# Patient Record
Sex: Male | Born: 1960 | Hispanic: No | Marital: Single | Smoking: Never smoker
Health system: Southern US, Community
[De-identification: ages and names within clinical notes are randomized; demographics above are authoritative.]

## PROBLEM LIST (undated history)

## (undated) DIAGNOSIS — E785 Hyperlipidemia, unspecified: Secondary | ICD-10-CM

## (undated) DIAGNOSIS — B20 Human immunodeficiency virus [HIV] disease: Secondary | ICD-10-CM

## (undated) DIAGNOSIS — Z21 Asymptomatic human immunodeficiency virus [HIV] infection status: Secondary | ICD-10-CM

## (undated) DIAGNOSIS — I10 Essential (primary) hypertension: Secondary | ICD-10-CM

## (undated) HISTORY — DX: Essential (primary) hypertension: I10

## (undated) HISTORY — DX: Hyperlipidemia, unspecified: E78.5

## (undated) HISTORY — DX: Asymptomatic human immunodeficiency virus (hiv) infection status: Z21

## (undated) HISTORY — DX: Human immunodeficiency virus (HIV) disease: B20

## (undated) HISTORY — PX: INCISION AND DRAINAGE ABSCESS: SHX5864

---

## 2013-12-02 ENCOUNTER — Encounter (INDEPENDENT_AMBULATORY_CARE_PROVIDER_SITE_OTHER): Payer: Self-pay

## 2013-12-02 ENCOUNTER — Ambulatory Visit: Payer: Medicaid Other | Attending: Internal Medicine | Admitting: Internal Medicine

## 2013-12-02 VITALS — BP 163/78 | HR 65 | Temp 97.9°F | Resp 16 | Wt 183.8 lb

## 2013-12-02 DIAGNOSIS — Z0000 Encounter for general adult medical examination without abnormal findings: Secondary | ICD-10-CM

## 2013-12-02 DIAGNOSIS — E669 Obesity, unspecified: Secondary | ICD-10-CM

## 2013-12-02 DIAGNOSIS — I10 Essential (primary) hypertension: Secondary | ICD-10-CM

## 2013-12-02 MED ORDER — HYDROCORTISONE ACETATE 25 MG RE SUPP: 25.0000 mg | Freq: Two times a day (BID) | RECTAL | Status: DC

## 2013-12-02 MED ORDER — HYDROCHLOROTHIAZIDE 25 MG PO TABS
25.0000 mg | ORAL_TABLET | Freq: Every day | ORAL | Status: DC
Start: 2013-12-02 — End: 2014-03-14

## 2013-12-02 MED ORDER — HYDROCORTISONE 2.5 % RE CREA: 1.0000 "application " | TOPICAL_CREAM | Freq: Two times a day (BID) | RECTAL | Status: DC

## 2013-12-02 MED ORDER — HYDROCORTISONE 2.5 % EX OINT: TOPICAL_OINTMENT | Freq: Two times a day (BID) | CUTANEOUS | Status: DC

## 2013-12-02 NOTE — Progress Notes (Unsigned)
Patient ID: Isaiah Mclaughlin, male   DOB: 1961/02/02, 53 y.o.   MRN: [id number]   HPI: Isaiah Mclaughlin is a 53 y.o. male presenting on 12/02/2013 to establish care and for treatment of hemorrhoids. He notes itching in the anal area and mild pain - no bleeding.     History reviewed. No pertinent past medical history.   past surgical history- abscess I and D left abdomen/flank  Current Outpatient Prescriptions  Medication Sig Dispense Refill  . hydrocortisone (ANUSOL-HC) 25 MG suppository Place 1 suppository (25 mg total) rectally 2 (two) times daily.  12 suppository  3  . hydrocortisone 2.5 % ointment Apply topically 2 (two) times daily.  30 g  0   No current facility-administered medications for this visit.    No Known Allergies  History reviewed. No pertinent family history.  History   Social History  . Marital Status: Single    Spouse Name: N/A    Number of Children: N/A  . Years of Education: N/A   Occupational History  . Not on file.   Social History Main Topics  . Smoking status: Never Smoker   . Smokeless tobacco: Not on file  . Alcohol Use: Yes  . Drug Use: Not on file  . Sexual Activity: Not on file   Other Topics Concern  . Not on file   Social History Narrative  . No narrative on file    Review of Systems  Review of Systems  Constitutional: Negative for fever, chills, diaphoresis, activity change, appetite change + fatigue.  HENT: Negative for ear pain, nosebleeds, congestion, facial swelling, rhinorrhea, neck pain, neck stiffness and ear discharge.  Eyes: Negative for pain, discharge, redness, itching and visual disturbance.  Respiratory: Negative for cough, choking, chest tightness, shortness of breath, wheezing and stridor.  Cardiovascular: Negative for chest pain, palpitations and leg swelling.  Gastrointestinal: Negative for abdominal distention, vomiting, diarrhea or consitpation Genitourinary: Negative for dysuria, urgency, frequency,  hematuria, flank pain, decreased urine volume, difficulty urinating and dyspareunia.  Musculoskeletal: Negative for back pain, joint swelling, arthralgias or gait problem.  Neurological: Negative for dizziness, tremors, seizures, syncope, facial asymmetry, speech difficulty, weakness, light-headedness, numbness and headaches.  Hematological: Negative for adenopathy. Does not bruise/bleed easily.  Psychiatric/Behavioral: Negative for hallucinations, behavioral problems, confusion, dysphoric mood   Objective:  BP 163/78  Pulse 65  Temp(Src) 97.9 F (36.6 C)  Resp 16  Wt 183 lb 12.8 oz (83.371 kg)  SpO2 100% Filed Weights   12/02/13 0936  Weight: 183 lb 12.8 oz (83.371 kg)     Physical Exam  Constitutional: Appears well-developed and well-nourished. No distress. HENT: Normocephalic. External right and left ear normal. Oropharynx is clear and moist.  Eyes: Conjunctivae and EOM are normal. PERRLA, no scleral icterus.  Neck: Normal ROM. Neck supple. No JVD. No tracheal deviation. No thyromegaly.  CVS: RRR, S1/S2 +, no murmurs, no gallops, no carotid bruit.  Pulmonary: Effort and breath sounds normal, no stridor, rhonchi, wheezes, rales.  Abdominal: Soft. BS +,  no distension, tenderness, rebound or guarding.  Musculoskeletal: Normal range of motion. No edema and no tenderness.  Neuro: Alert. Normal reflexes, muscle tone coordination. No cranial nerve deficit. Skin: Skin is warm and dry.  dry hyperkeratotic rash on dorsum of right foot  Not diaphoretic. No erythema. No pallor.  Psychiatric: Normal mood and affect. Behavior, judgment, thought content normal.   No results found for this basename: WBC,  HGB,  HCT,  MCV,  PLT   No results found  for this basename: CREATININE,  BUN,  NA,  K,  CL,  CO2    No results found for this basename: HGBA1C   Lipid Panel  No results found for this basename: chol,  trig,  hdl,  cholhdl,  vldl,  ldlcalc        There are no active problems to  display for this patient.    Preventative Medicine:  Health Maintenance  Topic Date Due  . Tetanus/tdap  09/28/1980  . Colonoscopy  09/29/2011  . Influenza Vaccine  04/28/2013    Adult vaccines due  Topic Date Due  . Tetanus/tdap  09/28/1980    Colonoscopy : ordered referral   LAB WORK: ordered Metabolic panel: CBC:  Vitamin D : Lipid Panel: TSH: PSA:    Assessment and plan: Routine history and physical examination of adult - Plan: CBC, COMPLETE METABOLIC PANEL WITH GFR, TSH, Lipid panel, Vit D  25 hydroxy (rtn osteoporosis monitoring), PSA, Ambulatory referral to Dentistry  HTN (hypertension) -  - as I do not have a baseline Cr, will avoid ACE I- start HCTZ 25 mg - can add ACE later if renal function reasonable  Obesity, unspecified -  - wt loss discussed   Internal hemorrhoids? - try anusol suppositories  Rash- eczema - hydrocort 2.5 % ointment   Return in about 3 months (around 03/04/2014).   The patient was given clear instructions to go to ER or return to medical center if symptoms don't improve, worsen or new problems develop. The patient verbalized understanding. The patient was told to call to get lab results if they haven't heard anything in the next week.     Debbe Odea, MD

## 2013-12-02 NOTE — Progress Notes (Unsigned)
Patient here to establish care Complains of having some hemorrhoids and some gas after meals Takes no prescribed medications

## 2013-12-08 ENCOUNTER — Ambulatory Visit: Payer: Medicaid Other | Attending: Internal Medicine

## 2013-12-08 DIAGNOSIS — Z0000 Encounter for general adult medical examination without abnormal findings: Secondary | ICD-10-CM

## 2013-12-08 DIAGNOSIS — E669 Obesity, unspecified: Secondary | ICD-10-CM

## 2013-12-08 DIAGNOSIS — I10 Essential (primary) hypertension: Secondary | ICD-10-CM

## 2013-12-08 LAB — LIPID PANEL
Cholesterol: 199 mg/dL (ref 0–200)
HDL: 39 mg/dL — ABNORMAL LOW
LDL Cholesterol: 143 mg/dL — ABNORMAL HIGH (ref 0–99)
Total CHOL/HDL Ratio: 5.1 ratio
Triglycerides: 86 mg/dL
VLDL: 17 mg/dL (ref 0–40)

## 2013-12-08 LAB — COMPLETE METABOLIC PANEL WITH GFR
ALK PHOS: 59 U/L (ref 39–117)
ALT: 36 U/L (ref 0–53)
AST: 18 U/L (ref 0–37)
Albumin: 4.4 g/dL (ref 3.5–5.2)
BILIRUBIN TOTAL: 0.7 mg/dL (ref 0.2–1.2)
BUN: 13 mg/dL (ref 6–23)
CO2: 31 mEq/L (ref 19–32)
Calcium: 9.2 mg/dL (ref 8.4–10.5)
Chloride: 97 mEq/L (ref 96–112)
Creat: 0.77 mg/dL (ref 0.50–1.35)
GFR, Est African American: >89 mL/min
GFR, Est Non African American: >89 mL/min
Glucose, Bld: 81 mg/dL (ref 70–99)
Potassium: 3.6 mEq/L (ref 3.5–5.3)
Sodium: 136 mEq/L (ref 135–145)
Total Protein: 7.7 g/dL (ref 6.0–8.3)

## 2013-12-08 LAB — TSH: TSH: 3.676 u[IU]/mL (ref 0.350–4.500)

## 2013-12-08 LAB — CBC
HCT: 45.5 % (ref 39.0–52.0)
Hemoglobin: 16.1 g/dL (ref 13.0–17.0)
MCH: 28.6 pg (ref 26.0–34.0)
MCHC: 35.4 g/dL (ref 30.0–36.0)
MCV: 81 fL (ref 78.0–100.0)
PLATELETS: 24 10*3/uL — AB (ref 150–400)
RBC: 5.62 MIL/uL (ref 4.22–5.81)
RDW: 13.9 % (ref 11.5–15.5)
WBC: 5.7 10*3/uL (ref 4.0–10.5)

## 2013-12-09 LAB — VITAMIN D 25 HYDROXY (VIT D DEFICIENCY, FRACTURES): Vit D, 25-Hydroxy: 31 ng/mL (ref 30–89)

## 2013-12-09 LAB — PSA: PSA: 0.8 ng/mL (ref <=4.00)

## 2013-12-14 ENCOUNTER — Telehealth: Payer: Self-pay | Admitting: Emergency Medicine

## 2013-12-14 NOTE — Telephone Encounter (Signed)
Left message for pt to call ASAP

## 2013-12-14 NOTE — Telephone Encounter (Signed)
Left message #2 attempt for pt to call ASAP

## 2013-12-14 NOTE — Telephone Encounter (Signed)
Message copied by Ricci Barker on Thu Dec 14, 2013  1:40 PM ------      Message from: Debbe Odea      Created: Thu Dec 14, 2013 10:15 AM       Please have pt come back for CBC, PT/ INR and PTT ASAP. Schedule earliest possible appt to review these labs. Thanks. ------

## 2013-12-15 ENCOUNTER — Ambulatory Visit: Payer: Medicaid Other | Attending: Internal Medicine

## 2013-12-15 DIAGNOSIS — R7989 Other specified abnormal findings of blood chemistry: Secondary | ICD-10-CM

## 2013-12-15 LAB — CBC WITH DIFFERENTIAL/PLATELET
Basophils Absolute: 0 10*3/uL (ref 0.0–0.1)
Basophils Relative: 0 % (ref 0–1)
EOS ABS: 0.6 10*3/uL (ref 0.0–0.7)
EOS PCT: 9 % — AB (ref 0–5)
HEMATOCRIT: 45.6 % (ref 39.0–52.0)
Hemoglobin: 16.1 g/dL (ref 13.0–17.0)
LYMPHS ABS: 3 10*3/uL (ref 0.7–4.0)
Lymphocytes Relative: 43 % (ref 12–46)
MCH: 28.9 pg (ref 26.0–34.0)
MCHC: 35.3 g/dL (ref 30.0–36.0)
MCV: 81.9 fL (ref 78.0–100.0)
MONO ABS: 0.8 10*3/uL (ref 0.1–1.0)
Monocytes Relative: 11 % (ref 3–12)
Neutro Abs: 2.6 10*3/uL (ref 1.7–7.7)
Neutrophils Relative %: 37 % — ABNORMAL LOW (ref 43–77)
PLATELETS: 27 10*3/uL — AB (ref 150–400)
RBC: 5.57 MIL/uL (ref 4.22–5.81)
RDW: 14 % (ref 11.5–15.5)
WBC: 6.9 10*3/uL (ref 4.0–10.5)

## 2013-12-16 LAB — PROTIME-INR
INR: 0.99 (ref <1.50)
Prothrombin Time: 13 seconds (ref 11.6–15.2)

## 2013-12-16 LAB — APTT: aPTT: 31 seconds (ref 24–37)

## 2013-12-21 ENCOUNTER — Telehealth: Payer: Self-pay | Admitting: Emergency Medicine

## 2013-12-21 NOTE — Telephone Encounter (Signed)
Left message for pt to return call for U/S scheduling and referral

## 2013-12-22 ENCOUNTER — Ambulatory Visit: Payer: Medicaid Other | Attending: Internal Medicine

## 2013-12-22 VITALS — BP 148/83 | HR 64 | Temp 98.2°F | Resp 16

## 2013-12-22 DIAGNOSIS — R079 Chest pain, unspecified: Secondary | ICD-10-CM

## 2013-12-22 MED ORDER — NITROGLYCERIN 0.4 MG SL SUBL: 0.4000 mg | SUBLINGUAL_TABLET | SUBLINGUAL | Status: DC | PRN

## 2013-12-22 NOTE — Patient Instructions (Addendum)
Pt instructed to go directly to ER if Chest pain continues with taking Nitro Pt also instructed not to take [facility] Hematology appt 12/25/13 @ [place] Pt given U/S appt scheduled 12/26/13 at [facility] hospital @1045  am Echo appt 12/29/13 @ 11am scheduled Tigranian language line used for intepretation

## 2013-12-22 NOTE — Progress Notes (Unsigned)
Pt is here for BP check only. 

## 2013-12-25 ENCOUNTER — Telehealth: Payer: Self-pay | Admitting: Internal Medicine

## 2013-12-25 ENCOUNTER — Telehealth: Payer: Self-pay | Admitting: Emergency Medicine

## 2013-12-25 NOTE — Telephone Encounter (Signed)
Message copied by Ricci Barker on Mon Dec 25, 2013  5:19 PM ------      Message from: Allyson Sabal MD, Ascencion Dike      Created: Wed Dec 20, 2013 11:21 AM       Notify patient that platelet count was still unchanged at 27. He has been referred to hematology for thrombocytopenia. He has been recommended to get an ultrasound to rule out cirrhosis. He has also been recommended to refrain from alcohol and NSAID. ------

## 2013-12-25 NOTE — Progress Notes (Signed)
U/s scheduled. Pt aware of results

## 2013-12-25 NOTE — Telephone Encounter (Signed)
C/D 12/25/13 for appt. 12/27/13

## 2013-12-25 NOTE — Telephone Encounter (Signed)
Called [employer] to call patient and gave new patient appt for 04/01 @ 1:30 w/Dr. Julien Nordmann.  Referring Dr. Reyne Dumas  Dx- Low Plt Ct Address was text by [facility].

## 2013-12-26 ENCOUNTER — Ambulatory Visit: Payer: Medicaid Other | Attending: Internal Medicine

## 2013-12-26 ENCOUNTER — Other Ambulatory Visit: Payer: Self-pay | Admitting: *Deleted

## 2013-12-26 ENCOUNTER — Ambulatory Visit (HOSPITAL_COMMUNITY)
Admission: RE | Admit: 2013-12-26 | Discharge: 2013-12-26 | Disposition: A | Discharge status: Home / Self Care | Payer: Medicaid Other | Source: Ambulatory Visit | Attending: Internal Medicine | Admitting: Internal Medicine

## 2013-12-26 DIAGNOSIS — R7989 Other specified abnormal findings of blood chemistry: Secondary | ICD-10-CM

## 2013-12-26 DIAGNOSIS — D696 Thrombocytopenia, unspecified: Secondary | ICD-10-CM

## 2013-12-26 DIAGNOSIS — R6889 Other general symptoms and signs: Secondary | ICD-10-CM | POA: Insufficient documentation

## 2013-12-26 DIAGNOSIS — Q619 Cystic kidney disease, unspecified: Secondary | ICD-10-CM | POA: Insufficient documentation

## 2013-12-26 LAB — CBC WITH DIFFERENTIAL/PLATELET
BASOS PCT: 0 % (ref 0–1)
Basophils Absolute: 0 10*3/uL (ref 0.0–0.1)
EOS ABS: 0.1 10*3/uL (ref 0.0–0.7)
Eosinophils Relative: 3 % (ref 0–5)
HEMATOCRIT: 42.6 % (ref 39.0–52.0)
HEMOGLOBIN: 15.3 g/dL (ref 13.0–17.0)
Lymphocytes Relative: 32 % (ref 12–46)
Lymphs Abs: 1.4 10*3/uL (ref 0.7–4.0)
MCH: 28.4 pg (ref 26.0–34.0)
MCHC: 35.9 g/dL (ref 30.0–36.0)
MCV: 79.2 fL (ref 78.0–100.0)
MONO ABS: 0.4 10*3/uL (ref 0.1–1.0)
MONOS PCT: 10 % (ref 3–12)
Neutro Abs: 2.4 10*3/uL (ref 1.7–7.7)
Neutrophils Relative %: 55 % (ref 43–77)
Platelets: 23 10*3/uL — CL (ref 150–400)
RBC: 5.38 MIL/uL (ref 4.22–5.81)
RDW: 13.9 % (ref 11.5–15.5)
WBC: 4.4 10*3/uL (ref 4.0–10.5)

## 2013-12-26 NOTE — Progress Notes (Unsigned)
Pt here for repeat CBC secondary to abnormal PLT count  Pt u/s showed fatty liver Pt has scheduled appt with Oncology 12/27/13 @ 130 pm. Verbalized understanding

## 2013-12-26 NOTE — Telephone Encounter (Signed)
Pt given scheduled U/S appt 01/05/14 @ 2pm via [place] interpretor line Pt verbalized understanding Pt also scheduled to return for CBC repeat today

## 2013-12-27 ENCOUNTER — Encounter: Payer: Self-pay | Admitting: Internal Medicine

## 2013-12-27 ENCOUNTER — Ambulatory Visit (HOSPITAL_BASED_OUTPATIENT_CLINIC_OR_DEPARTMENT_OTHER): Payer: Medicaid Other | Admitting: Internal Medicine

## 2013-12-27 ENCOUNTER — Telehealth: Payer: Self-pay | Admitting: Internal Medicine

## 2013-12-27 ENCOUNTER — Ambulatory Visit: Payer: Medicaid Other

## 2013-12-27 ENCOUNTER — Other Ambulatory Visit (HOSPITAL_BASED_OUTPATIENT_CLINIC_OR_DEPARTMENT_OTHER): Payer: Medicaid Other

## 2013-12-27 ENCOUNTER — Other Ambulatory Visit: Payer: Self-pay

## 2013-12-27 VITALS — BP 149/109 | HR 77 | Temp 97.5°F | Resp 20 | Ht 65.0 in | Wt 179.0 lb

## 2013-12-27 DIAGNOSIS — D696 Thrombocytopenia, unspecified: Secondary | ICD-10-CM

## 2013-12-27 DIAGNOSIS — R079 Chest pain, unspecified: Secondary | ICD-10-CM

## 2013-12-27 LAB — CBC WITH DIFFERENTIAL/PLATELET
BASO%: 0.2 % (ref 0.0–2.0)
Basophils Absolute: 0 10*3/uL (ref 0.0–0.1)
EOS%: 4 % (ref 0.0–7.0)
Eosinophils Absolute: 0.2 10*3/uL (ref 0.0–0.5)
HCT: 44.2 % (ref 38.4–49.9)
HGB: 15 g/dL (ref 13.0–17.1)
LYMPH%: 40.9 % (ref 14.0–49.0)
MCH: 28.1 pg (ref 27.2–33.4)
MCHC: 33.9 g/dL (ref 32.0–36.0)
MCV: 83 fL (ref 79.3–98.0)
MONO#: 0.6 10*3/uL (ref 0.1–0.9)
MONO%: 12.2 % (ref 0.0–14.0)
NEUT#: 2.1 10*3/uL (ref 1.5–6.5)
NEUT%: 42.7 % (ref 39.0–75.0)
Platelets: 22 10*3/uL — ABNORMAL LOW (ref 140–400)
RBC: 5.32 10*6/uL (ref 4.20–5.82)
RDW: 13.5 % (ref 11.0–14.6)
WBC: 5 10*3/uL (ref 4.0–10.3)
lymph#: 2 10*3/uL (ref 0.9–3.3)

## 2013-12-27 LAB — TECHNOLOGIST REVIEW

## 2013-12-27 MED ORDER — OMEPRAZOLE 40 MG PO CPDR: 40.0000 mg | DELAYED_RELEASE_CAPSULE | Freq: Every day | ORAL | Status: DC

## 2013-12-27 MED ORDER — PREDNISONE 20 MG PO TABS: ORAL_TABLET | ORAL | Status: DC

## 2013-12-27 NOTE — Progress Notes (Signed)
Pt aware of results. He came in person yesterday. [employer] line used for [employer]

## 2013-12-27 NOTE — Progress Notes (Signed)
[facility] [phone]   [phone]  CONSULT NOTE  REFERRING PHYSICIAN: Dr. Debbe Odea  REASON FOR CONSULTATION:  53 years old African male with low platelets count.  HPI Isaiah Mclaughlin is a 53 y.o. male with past medical history significant for hypertension and recent diagnosis of hemorrhoids. The patient is originally from [place] and speak little English but I was able to communicate with him in Arabic which he speaks very well. His native language is [place]. The patient mentions that he moved to the [place] 2 months ago. He has been complaining of left-sided chest pain with radiation to the back but this was not associated with any shortness of breath, nausea or vomiting or diaphoresis. He has no radiation of his pain to the neck or the left arm. He was seen recently [facility] the [facility] for evaluation and treatment of hemorrhoids and also to establish care with them. CBC performed on 12/08/2013 showed low platelets count of 24,000. Repeat CBC on 12/15/2013 showed persistent thrombocytopenia with platelets count of 27,000. He has normal white blood count as well as normal hemoglobin and hematocrit. The patient was also found to have hypertension and was started on treatment with hydrochlorothiazide. He denied having any bleeding issues, bruises or ecchymosis. He has occasional gum bleed. He denied having any other significant complaints except for heartburn and bloating. He does not take any medication over the counter. He denied having any significant fever or chills, no nausea or vomiting, no weight loss or night sweats. He continues to have the left-sided chest pain but no shortness breath, cough or hemoptysis. He has no headache or blurry vision.   HPI  PAST MEDICAL HISTORY: Significant only for hypertension and hemorrhoids. The patient denied having any history of coronary arteries disease, diabetes mellitus or  stroke.  FAMILY HISTORY: Mother and father are alive in their 28s and doing well.  SOCIAL HISTORY: The patient is married and has one son. He is originally from [place], and moved to [place] 2 months ago. He has no history of smoking but drinks around 4 beers every day and no history of drug abuse.   No Known Allergies  Current Outpatient Prescriptions  Medication Sig Dispense Refill  . hydrochlorothiazide (HYDRODIURIL) 25 MG tablet Take 1 tablet (25 mg total) by mouth daily.  90 tablet  3  . hydrocortisone (ANUSOL-HC) 25 MG suppository Place 1 suppository (25 mg total) rectally 2 (two) times daily.  12 suppository  3  . hydrocortisone 2.5 % ointment Apply topically 2 (two) times daily.  30 g  0  . nitroGLYCERIN (NITROSTAT) 0.4 MG SL tablet Place 1 tablet (0.4 mg total) under the tongue every 5 (five) minutes as needed for chest pain.  60 tablet  2   No current facility-administered medications for this visit.    Review of Systems  Constitutional: negative Eyes: negative Ears, nose, mouth, throat, and face: negative Respiratory: negative Cardiovascular: positive for chest pressure/discomfort Gastrointestinal: positive for dyspepsia Genitourinary:negative Integument/breast: negative Hematologic/lymphatic: negative Musculoskeletal:positive for myalgias Neurological: negative Behavioral/Psych: negative Endocrine: negative Allergic/Immunologic: negative  Physical Exam  BOF:BPZWC, healthy, no distress, well nourished and well developed SKIN: skin color, texture, turgor are normal, no rashes or significant lesions HEAD: Normocephalic, No masses, lesions, tenderness or abnormalities EYES: normal, PERRLA EARS: External ears normal, Canals clear OROPHARYNX:no exudate, no erythema and lips, buccal mucosa, and tongue normal  NECK: supple, no adenopathy, no JVD LYMPH:  no palpable lymphadenopathy, no hepatosplenomegaly LUNGS:  clear to auscultation , and palpation HEART: regular rate &  rhythm, no murmurs and no gallops ABDOMEN:abdomen soft, non-tender, normal bowel sounds and no masses or organomegaly BACK: Back symmetric, no curvature., No CVA tenderness EXTREMITIES:no joint deformities, effusion, or inflammation, no edema, no skin discoloration  NEURO: alert & oriented x 3 with fluent speech, no focal motor/sensory deficits  PERFORMANCE STATUS: ECOG 0  LABORATORY DATA: Lab Results  Component Value Date   WBC 5.0 12/27/2013   HGB 15.0 12/27/2013   HCT 44.2 12/27/2013   MCV 83.0 12/27/2013   PLT 22* 12/27/2013      Chemistry      Component Value Date/Time   NA 136 12/08/2013 0908   K 3.6 12/08/2013 0908   CL 97 12/08/2013 0908   CO2 31 12/08/2013 0908   BUN 13 12/08/2013 0908   CREATININE 0.77 12/08/2013 0908      Component Value Date/Time   CALCIUM 9.2 12/08/2013 0908   ALKPHOS 59 12/08/2013 0908   AST 18 12/08/2013 0908   ALT 36 12/08/2013 0908   BILITOT 0.7 12/08/2013 0908       RADIOGRAPHIC STUDIES: US Abdomen Complete  12/26/2013   CLINICAL DATA:  Abnormal laboratory values  EXAM: ULTRASOUND ABDOMEN COMPLETE  COMPARISON:  None.  FINDINGS: Gallbladder:  No gallstones or wall thickening visualized. No sonographic Murphy sign noted.  Common bile duct:  Diameter: 3.2 mm  Liver:  No focal lesion identified. Within normal limits in parenchymal echogenicity.  IVC:  No abnormality visualized.  Pancreas:  The pancreatic head and body appear normal. The pancreatic tail was obscured by bowel gas.  Spleen:  Size and appearance within normal limits.  Right Kidney:  Length: 10.2 cm. Echogenicity within normal limits. No hydronephrosis visualized. There is a 1 cm diameter midpole cortical cyst.  Left Kidney:  Length: 10.5 cm. Echogenicity within normal limits. No hydronephrosis visualized. There is a 2.9 cm cyst in the upper pole cortex of the left kidney.  Abdominal aorta:  No aneurysm visualized.  Other findings:  None.  IMPRESSION: 1. Increased echotexture of the liver suggests fatty  infiltration. There is no focal mass or ductal dilation. The gallbladder, common bile duct, and visualized portions of the pancreas appear normal. 2. There are simple appearing cysts in both kidneys. 3. The spleen and abdominal aorta and inferior vena cava are within the limits of normal.   Electronically Signed   By: David  Martinique   On: 12/26/2013 12:32    ASSESSMENT: This is a very pleasant 53 years old African male who presented today for evaluation of thrombocytopenia. This is best likely idiopathic thrombocytopenic purpura or drug-induced. The patient has no renal insufficiency, fever or neurological abnormalities and no evidence of schistocytes in the peripheral blood smear to be concerned about TTP. His abdominal ultrasound showed fatty infiltration of the liver but no splenomegaly. He also continues to complain of left-sided chest pain with radiation to the back.  PLAN: I have a lengthy discussion with the patient today about his current condition and treatment options. His platelets count continues to be less than 30,000. The patient is currently asymptomatic. I given him the option of continuous observation and close monitoring versus proceeding with treatment with steroids to improve his platelets count. He would like to consider the treatment and I will start him on prednisone 1 mg/kilogram daily for the next 2 weeks. His calculated dose is 80 mg by mouth daily. I will also start the patient on Prilosec 40 mg  by mouth daily for GI prophylaxis. I will see him back for followup visit in 2 weeks for reevaluation with repeat CBC, comprehensive metabolic panel and LDH. If his platelets count improves I will taper his dose of prednisone gradually. The patient agreed to the current plan. For the left-sided chest pain, I ordered an EKG today and it showed no significant abnormality. If he continues to have persistent pain the patient may need to see cardiologist for evaluation. He was advised to  call immediately if he has any concerning symptoms in the interval. The patient voices understanding of current disease status and treatment options and is in agreement with the current care plan.  All questions were answered. The patient knows to call the clinic with any problems, questions or concerns. We can certainly see the patient much sooner if necessary.  Thank you so much for allowing me to participate in the care of Isaiah Mclaughlin. I will continue to follow up the patient with you and assist in his care.  I spent 40 minutes counseling the patient face to face. The total time spent in the appointment was 60 minutes.  Disclaimer: This note was dictated with voice recognition software. Similar sounding words can inadvertently be transcribed and may not be corrected upon review.   Arantxa Piercey K. 12/27/2013, 2:32 PM

## 2013-12-27 NOTE — Progress Notes (Signed)
Patient did not speak english.  Unable to review medication list and complete a collaborative assessment with patient.  Dr Julien Nordmann notified.  SLJ

## 2013-12-27 NOTE — Progress Notes (Signed)
Checked in new patient with no financial issues. He has no one as release of info. It is hard for him to understand [place].

## 2013-12-27 NOTE — Telephone Encounter (Signed)
per [facility] sch lab & Dr appt in 2wks/sch 4/13-printed pt copy of sch& app

## 2013-12-29 ENCOUNTER — Ambulatory Visit (HOSPITAL_COMMUNITY)
Admission: RE | Admit: 2013-12-29 | Discharge: 2013-12-29 | Disposition: A | Discharge status: Home / Self Care | Payer: Medicaid Other | Source: Ambulatory Visit | Attending: Internal Medicine | Admitting: Internal Medicine

## 2013-12-29 DIAGNOSIS — R079 Chest pain, unspecified: Secondary | ICD-10-CM | POA: Insufficient documentation

## 2013-12-29 DIAGNOSIS — R072 Precordial pain: Secondary | ICD-10-CM

## 2013-12-29 NOTE — Progress Notes (Signed)
  Echocardiogram 2D Echocardiogram has been performed.  Isaiah Mclaughlin 12/29/2013, 11:42 AM

## 2014-01-05 ENCOUNTER — Ambulatory Visit (HOSPITAL_COMMUNITY): Payer: Medicaid Other

## 2014-01-08 ENCOUNTER — Other Ambulatory Visit: Payer: Medicaid Other

## 2014-01-08 ENCOUNTER — Ambulatory Visit: Payer: Medicaid Other | Admitting: Internal Medicine

## 2014-01-10 ENCOUNTER — Telehealth: Payer: Self-pay | Admitting: Internal Medicine

## 2014-01-10 NOTE — Telephone Encounter (Signed)
S/W PATIENT BY [employer] AND GAVE NEW APPT FOR 04/16 @ 8:30 W/DR. MOHAMED

## 2014-01-11 ENCOUNTER — Encounter: Payer: Self-pay | Admitting: Internal Medicine

## 2014-01-11 ENCOUNTER — Ambulatory Visit (HOSPITAL_BASED_OUTPATIENT_CLINIC_OR_DEPARTMENT_OTHER): Payer: Medicaid Other | Admitting: Internal Medicine

## 2014-01-11 ENCOUNTER — Telehealth: Payer: Self-pay | Admitting: Internal Medicine

## 2014-01-11 ENCOUNTER — Other Ambulatory Visit: Payer: Medicaid Other

## 2014-01-11 VITALS — BP 154/82 | HR 58 | Temp 97.5°F | Resp 18 | Ht 65.0 in | Wt 175.2 lb

## 2014-01-11 DIAGNOSIS — D696 Thrombocytopenia, unspecified: Secondary | ICD-10-CM

## 2014-01-11 DIAGNOSIS — D693 Immune thrombocytopenic purpura: Secondary | ICD-10-CM

## 2014-01-11 DIAGNOSIS — D473 Essential (hemorrhagic) thrombocythemia: Secondary | ICD-10-CM

## 2014-01-11 LAB — CBC WITH DIFFERENTIAL/PLATELET
BASO%: 0 % (ref 0.0–2.0)
Basophils Absolute: 0 10*3/uL (ref 0.0–0.1)
EOS ABS: 0 10*3/uL (ref 0.0–0.5)
EOS%: 0 % (ref 0.0–7.0)
HCT: 47.6 % (ref 38.4–49.9)
HGB: 17 g/dL (ref 13.0–17.1)
LYMPH#: 1.9 10*3/uL (ref 0.9–3.3)
LYMPH%: 13.6 % — AB (ref 14.0–49.0)
MCH: 29.2 pg (ref 27.2–33.4)
MCHC: 35.7 g/dL (ref 32.0–36.0)
MCV: 81.8 fL (ref 79.3–98.0)
MONO#: 0.4 10*3/uL (ref 0.1–0.9)
MONO%: 3.2 % (ref 0.0–14.0)
NEUT%: 83.2 % — ABNORMAL HIGH (ref 39.0–75.0)
NEUTROS ABS: 11.4 10*3/uL — AB (ref 1.5–6.5)
PLATELETS: 78 10*3/uL — AB (ref 140–400)
RBC: 5.82 10*6/uL (ref 4.20–5.82)
RDW: 13.9 % (ref 11.0–14.6)
WBC: 13.7 10*3/uL — ABNORMAL HIGH (ref 4.0–10.3)

## 2014-01-11 LAB — COMPREHENSIVE METABOLIC PANEL (CC13)
ALT: 80 U/L — AB (ref 0–55)
AST: 24 U/L (ref 5–34)
Albumin: 4.1 g/dL (ref 3.5–5.0)
Alkaline Phosphatase: 79 U/L (ref 40–150)
Anion Gap: 10 mEq/L (ref 3–11)
BUN: 14.3 mg/dL (ref 7.0–26.0)
CALCIUM: 9.3 mg/dL (ref 8.4–10.4)
CHLORIDE: 99 meq/L (ref 98–109)
CO2: 26 meq/L (ref 22–29)
Creatinine: 0.8 mg/dL (ref 0.7–1.3)
Glucose: 167 mg/dl — ABNORMAL HIGH (ref 70–140)
Potassium: 4.2 mEq/L (ref 3.5–5.1)
SODIUM: 136 meq/L (ref 136–145)
TOTAL PROTEIN: 7.9 g/dL (ref 6.4–8.3)
Total Bilirubin: 0.54 mg/dL (ref 0.20–1.20)

## 2014-01-11 LAB — LACTATE DEHYDROGENASE (CC13): LDH: 219 U/L (ref 125–245)

## 2014-01-11 NOTE — Progress Notes (Signed)
[facility] Telephone:([phone]   Fax:(336) [phone]  OFFICE PROGRESS NOTE  Angelica Chessman, MD [place] [address]  DIAGNOSIS: Idiopathic thrombocytopenic purpura (ITP)  PRIOR THERAPY: None  CURRENT THERAPY: Prednisone 80 mg by mouth daily started 2 weeks ago.  INTERVAL HISTORY: Isaiah Mclaughlin 53 y.o. male returns to the clinic today for followup visit. The patient is feeling fine today with no specific complaints. He denied having any significant bleeding issues, bruises or ecchymosis. The patient denied having any significant heartburn, nausea or vomiting. He denied having any chest pain, shortness breath, cough or hemoptysis. He is tolerating his treatment with prednisone fairly well. The patient has repeat CBC and comprehensive metabolic panel performed recently and he is here for evaluation and discussion of his lab results.  MEDICAL HISTORY:No past medical history on file.  ALLERGIES:  has No Known Allergies.  MEDICATIONS:  Current Outpatient Prescriptions  Medication Sig Dispense Refill  . hydrochlorothiazide (HYDRODIURIL) 25 MG tablet Take 1 tablet (25 mg total) by mouth daily.  90 tablet  3  . hydrocortisone (ANUSOL-HC) 25 MG suppository Place 1 suppository (25 mg total) rectally 2 (two) times daily.  12 suppository  3  . hydrocortisone 2.5 % ointment Apply topically 2 (two) times daily.  30 g  0  . nitroGLYCERIN (NITROSTAT) 0.4 MG SL tablet Place 1 tablet (0.4 mg total) under the tongue every 5 (five) minutes as needed for chest pain.  60 tablet  2  . omeprazole (PRILOSEC) 40 MG capsule Take 1 capsule (40 mg total) by mouth daily.  30 capsule  1  . predniSONE (DELTASONE) 20 MG tablet 4 tablets by mouth daily for 2 weeks, then the dose will be tapered gradually  118 tablet  0   No current facility-administered medications for this visit.    REVIEW OF SYSTEMS:  A comprehensive review of systems was negative.   PHYSICAL  EXAMINATION: General appearance: alert, cooperative and no distress Head: Normocephalic, without obvious abnormality, atraumatic Neck: no adenopathy, no JVD, supple, symmetrical, trachea midline and thyroid not enlarged, symmetric, no tenderness/mass/nodules Lymph nodes: Cervical, supraclavicular, and axillary nodes normal. Resp: clear to auscultation bilaterally Back: symmetric, no curvature. ROM normal. No CVA tenderness. Cardio: regular rate and rhythm, S1, S2 normal, no murmur, click, rub or gallop GI: soft, non-tender; bowel sounds normal; no masses,  no organomegaly Extremities: extremities normal, atraumatic, no cyanosis or edema  ECOG PERFORMANCE STATUS: 1 - Symptomatic but completely ambulatory  Blood pressure 154/82, pulse 58, temperature 97.5 F (36.4 C), temperature source Oral, resp. rate 18, height 5\' 5"  (1.651 m), weight 175 lb 3.2 oz (79.47 kg), SpO2 100.00%.  LABORATORY DATA: Lab Results  Component Value Date   WBC 13.7* 01/11/2014   HGB 17.0 01/11/2014   HCT 47.6 01/11/2014   MCV 81.8 01/11/2014   PLT 78* 01/11/2014      Chemistry      Component Value Date/Time   NA 136 01/11/2014 0805   NA 136 12/08/2013 0908   K 4.2 01/11/2014 0805   K 3.6 12/08/2013 0908   CL 97 12/08/2013 0908   CO2 26 01/11/2014 0805   CO2 31 12/08/2013 0908   BUN 14.3 01/11/2014 0805   BUN 13 12/08/2013 0908   CREATININE 0.8 01/11/2014 0805   CREATININE 0.77 12/08/2013 0908      Component Value Date/Time   CALCIUM 9.3 01/11/2014 0805   CALCIUM 9.2 12/08/2013 0908   ALKPHOS 79 01/11/2014 0805  ALKPHOS 59 12/08/2013 0908   AST 24 01/11/2014 0805   AST 18 12/08/2013 0908   ALT 80* 01/11/2014 0805   ALT 36 12/08/2013 0908   BILITOT 0.54 01/11/2014 0805   BILITOT 0.7 12/08/2013 0908       RADIOGRAPHIC STUDIES: US Abdomen Complete  12/26/2013   CLINICAL DATA:  Abnormal laboratory values  EXAM: ULTRASOUND ABDOMEN COMPLETE  COMPARISON:  None.  FINDINGS: Gallbladder:  No gallstones or wall thickening  visualized. No sonographic Murphy sign noted.  Common bile duct:  Diameter: 3.2 mm  Liver:  No focal lesion identified. Within normal limits in parenchymal echogenicity.  IVC:  No abnormality visualized.  Pancreas:  The pancreatic head and body appear normal. The pancreatic tail was obscured by bowel gas.  Spleen:  Size and appearance within normal limits.  Right Kidney:  Length: 10.2 cm. Echogenicity within normal limits. No hydronephrosis visualized. There is a 1 cm diameter midpole cortical cyst.  Left Kidney:  Length: 10.5 cm. Echogenicity within normal limits. No hydronephrosis visualized. There is a 2.9 cm cyst in the upper pole cortex of the left kidney.  Abdominal aorta:  No aneurysm visualized.  Other findings:  None.  IMPRESSION: 1. Increased echotexture of the liver suggests fatty infiltration. There is no focal mass or ductal dilation. The gallbladder, common bile duct, and visualized portions of the pancreas appear normal. 2. There are simple appearing cysts in both kidneys. 3. The spleen and abdominal aorta and inferior vena cava are within the limits of normal.   Electronically Signed   By: David  Martinique   On: 12/26/2013 12:32    ASSESSMENT AND PLAN: This is a very pleasant 53 years old African male with recently diagnosed idiopathic thrombocytopenic purpura currently on treatment with prednisone 80 mg by mouth daily for the last 2 weeks and tolerating it fairly well. Improvement in his platelets count which is up to 78,000 today. I recommended for the patient to continue on his current treatment with prednisone for 2 more weeks. I would see him back for followup visit at that time with repeat CBC, comprehensive metabolic panel and LDH. He will continue on Prilosec 40 mg by mouth daily for GI prophylaxis. He was advised to call immediately if he has any concerning symptoms in the interval.  The patient voices understanding of current disease status and treatment options and is in agreement  with the current care plan.  All questions were answered. The patient knows to call the clinic with any problems, questions or concerns. We can certainly see the patient much sooner if necessary.  Disclaimer: This note was dictated with voice recognition software. Similar sounding words can inadvertently be transcribed and may not be corrected upon review.

## 2014-01-11 NOTE — Telephone Encounter (Signed)
gv adn printed appt sched and avs for pt for April adn May  °

## 2014-01-18 ENCOUNTER — Other Ambulatory Visit: Payer: Self-pay | Admitting: Internal Medicine

## 2014-01-25 ENCOUNTER — Ambulatory Visit: Payer: Medicaid Other

## 2014-01-25 ENCOUNTER — Other Ambulatory Visit: Payer: Self-pay | Admitting: Internal Medicine

## 2014-01-25 ENCOUNTER — Other Ambulatory Visit (HOSPITAL_COMMUNITY)
Admission: RE | Admit: 2014-01-25 | Discharge: 2014-01-25 | Disposition: A | Discharge status: Home / Self Care | Payer: Medicaid Other | Source: Ambulatory Visit | Attending: Internal Medicine | Admitting: Internal Medicine

## 2014-01-25 DIAGNOSIS — Z79899 Other long term (current) drug therapy: Secondary | ICD-10-CM

## 2014-01-25 DIAGNOSIS — Z113 Encounter for screening for infections with a predominantly sexual mode of transmission: Secondary | ICD-10-CM | POA: Insufficient documentation

## 2014-01-25 DIAGNOSIS — B20 Human immunodeficiency virus [HIV] disease: Secondary | ICD-10-CM

## 2014-01-25 LAB — LIPID PANEL
CHOLESTEROL: 264 mg/dL — AB (ref 0–200)
HDL: 70 mg/dL (ref >39)
LDL Cholesterol: 164 mg/dL — ABNORMAL HIGH (ref 0–99)
Total CHOL/HDL Ratio: 3.8 Ratio
Triglycerides: 148 mg/dL (ref <150)
VLDL: 30 mg/dL (ref 0–40)

## 2014-01-25 LAB — COMPLETE METABOLIC PANEL WITH GFR
ALT: 61 U/L — ABNORMAL HIGH (ref 0–53)
AST: 14 U/L (ref 0–37)
Albumin: 4 g/dL (ref 3.5–5.2)
Alkaline Phosphatase: 61 U/L (ref 39–117)
BILIRUBIN TOTAL: 0.9 mg/dL (ref 0.2–1.2)
BUN: 15 mg/dL (ref 6–23)
CO2: 30 mEq/L (ref 19–32)
CREATININE: 0.73 mg/dL (ref 0.50–1.35)
Calcium: 8.5 mg/dL (ref 8.4–10.5)
Chloride: 97 mEq/L (ref 96–112)
GFR, Est African American: >89 mL/min
GLUCOSE: 104 mg/dL — AB (ref 70–99)
Potassium: 3.5 mEq/L (ref 3.5–5.3)
Sodium: 136 mEq/L (ref 135–145)
Total Protein: 6.2 g/dL (ref 6.0–8.3)

## 2014-01-26 DIAGNOSIS — B20 Human immunodeficiency virus [HIV] disease: Secondary | ICD-10-CM | POA: Insufficient documentation

## 2014-01-26 LAB — CBC WITH DIFFERENTIAL/PLATELET
BASOS ABS: 0 10*3/uL (ref 0.0–0.1)
Basophils Relative: 0 % (ref 0–1)
EOS ABS: 0.1 10*3/uL (ref 0.0–0.7)
Eosinophils Relative: 2 % (ref 0–5)
HEMATOCRIT: 45.8 % (ref 39.0–52.0)
Hemoglobin: 15.8 g/dL (ref 13.0–17.0)
Lymphocytes Relative: 18 % (ref 12–46)
Lymphs Abs: 1 10*3/uL (ref 0.7–4.0)
MCH: 28.8 pg (ref 26.0–34.0)
MCHC: 34.5 g/dL (ref 30.0–36.0)
MCV: 83.4 fL (ref 78.0–100.0)
MONOS PCT: 8 % (ref 3–12)
Monocytes Absolute: 0.4 10*3/uL (ref 0.1–1.0)
Neutro Abs: 4 10*3/uL (ref 1.7–7.7)
Neutrophils Relative %: 72 % (ref 43–77)
Platelets: 30 10*3/uL — ABNORMAL LOW (ref 150–400)
RBC: 5.49 MIL/uL (ref 4.22–5.81)
RDW: 15.4 % (ref 11.5–15.5)
WBC: 5.5 10*3/uL (ref 4.0–10.5)

## 2014-01-26 LAB — HEPATITIS B SURFACE ANTIBODY,QUALITATIVE: Hep B S Ab: NEGATIVE

## 2014-01-26 LAB — URINALYSIS
BILIRUBIN URINE: NEGATIVE
Glucose, UA: NEGATIVE mg/dL
HGB URINE DIPSTICK: NEGATIVE
Ketones, ur: NEGATIVE mg/dL
Leukocytes, UA: NEGATIVE
Nitrite: NEGATIVE
PROTEIN: NEGATIVE mg/dL
Specific Gravity, Urine: 1.02 (ref 1.005–1.030)
UROBILINOGEN UA: 0.2 mg/dL (ref 0.0–1.0)
pH: 7.5 (ref 5.0–8.0)

## 2014-01-26 LAB — URINE CYTOLOGY ANCILLARY ONLY
CHLAMYDIA, DNA PROBE: NEGATIVE
Neisseria Gonorrhea: NEGATIVE

## 2014-01-26 LAB — HEPATITIS C ANTIBODY: HCV AB: NEGATIVE

## 2014-01-26 LAB — RPR

## 2014-01-26 LAB — HEPATITIS A ANTIBODY, TOTAL: Hep A Total Ab: REACTIVE — AB

## 2014-01-26 LAB — HEPATITIS B CORE ANTIBODY, TOTAL: HEP B C TOTAL AB: NONREACTIVE

## 2014-01-26 LAB — T-HELPER CELL (CD4) - (RCID CLINIC ONLY)
CD4 % Helper T Cell: 19 % — ABNORMAL LOW (ref 33–55)
CD4 T CELL ABS: 210 /uL — AB (ref 400–2700)

## 2014-01-26 LAB — HIV-1 RNA ULTRAQUANT REFLEX TO GENTYP+
HIV 1 RNA QUANT: 283118 {copies}/mL — AB (ref <20)
HIV-1 RNA QUANT, LOG: 5.45 {Log} — AB (ref <1.30)

## 2014-01-26 LAB — HEPATITIS B SURFACE ANTIGEN: Hepatitis B Surface Ag: NEGATIVE

## 2014-01-29 ENCOUNTER — Encounter: Payer: Self-pay | Admitting: Internal Medicine

## 2014-01-29 ENCOUNTER — Telehealth: Payer: Self-pay | Admitting: Internal Medicine

## 2014-01-29 ENCOUNTER — Other Ambulatory Visit (HOSPITAL_BASED_OUTPATIENT_CLINIC_OR_DEPARTMENT_OTHER): Payer: Medicaid Other

## 2014-01-29 ENCOUNTER — Ambulatory Visit (HOSPITAL_BASED_OUTPATIENT_CLINIC_OR_DEPARTMENT_OTHER): Payer: Medicaid Other | Admitting: Internal Medicine

## 2014-01-29 VITALS — BP 159/89 | HR 79 | Temp 98.2°F | Resp 18 | Ht 65.0 in | Wt 177.4 lb

## 2014-01-29 DIAGNOSIS — D696 Thrombocytopenia, unspecified: Secondary | ICD-10-CM

## 2014-01-29 DIAGNOSIS — B20 Human immunodeficiency virus [HIV] disease: Secondary | ICD-10-CM

## 2014-01-29 LAB — CBC WITH DIFFERENTIAL/PLATELET
BASO%: 0.3 % (ref 0.0–2.0)
Basophils Absolute: 0 10*3/uL (ref 0.0–0.1)
EOS%: 3.8 % (ref 0.0–7.0)
Eosinophils Absolute: 0.3 10*3/uL (ref 0.0–0.5)
HCT: 46.9 % (ref 38.4–49.9)
HGB: 15.8 g/dL (ref 13.0–17.1)
LYMPH#: 1 10*3/uL (ref 0.9–3.3)
LYMPH%: 14.7 % (ref 14.0–49.0)
MCH: 28.9 pg (ref 27.2–33.4)
MCHC: 33.8 g/dL (ref 32.0–36.0)
MCV: 85.6 fL (ref 79.3–98.0)
MONO#: 0.5 10*3/uL (ref 0.1–0.9)
MONO%: 6.9 % (ref 0.0–14.0)
NEUT#: 4.9 10*3/uL (ref 1.5–6.5)
NEUT%: 74.3 % (ref 39.0–75.0)
Platelets: 28 10*3/uL — ABNORMAL LOW (ref 140–400)
RBC: 5.48 10*6/uL (ref 4.20–5.82)
RDW: 14.6 % (ref 11.0–14.6)
WBC: 6.6 10*3/uL (ref 4.0–10.3)

## 2014-01-29 LAB — COMPREHENSIVE METABOLIC PANEL (CC13)
ALT: 63 U/L — AB (ref 0–55)
ANION GAP: 13 meq/L — AB (ref 3–11)
AST: 33 U/L (ref 5–34)
Albumin: 3.8 g/dL (ref 3.5–5.0)
Alkaline Phosphatase: 81 U/L (ref 40–150)
BUN: 16.9 mg/dL (ref 7.0–26.0)
CALCIUM: 9 mg/dL (ref 8.4–10.4)
CHLORIDE: 101 meq/L (ref 98–109)
CO2: 27 meq/L (ref 22–29)
Creatinine: 0.8 mg/dL (ref 0.7–1.3)
Glucose: 115 mg/dl (ref 70–140)
POTASSIUM: 4 meq/L (ref 3.5–5.1)
SODIUM: 141 meq/L (ref 136–145)
TOTAL PROTEIN: 6.6 g/dL (ref 6.4–8.3)
Total Bilirubin: 0.88 mg/dL (ref 0.20–1.20)

## 2014-01-29 LAB — LACTATE DEHYDROGENASE (CC13): LDH: 249 U/L — ABNORMAL HIGH (ref 125–245)

## 2014-01-29 NOTE — Progress Notes (Signed)
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 Linus Salmons.  PRIOR THERAPY: Previous course of prednisone with initial improvement in platelets count then declined again.  CURRENT THERAPY: None  INTERVAL HISTORY: Isaiah Mclaughlin 53 y.o. male returns to the clinic today for followup visit. The patient is feeling fine today with no specific complaints. He was very upset after his recent diagnosis with HIV. He is currently followed by Dr. Linus Salmons for treatment of his condition but has not started treatment yet. He denied having any significant bleeding issues, bruises or ecchymosis. The patient denied having any significant heartburn, nausea or vomiting. He denied having any chest pain, shortness of breath, cough or hemoptysis. He discontinued his treatment with prednisone few days ago. The patient has repeat CBC performed recently and he is here for evaluation and discussion of his lab results.  MEDICAL HISTORY:No past medical history on file.  ALLERGIES:  has No Known Allergies.  MEDICATIONS:  Current Outpatient Prescriptions  Medication Sig Dispense Refill  . hydrochlorothiazide (HYDRODIURIL) 25 MG tablet Take 1 tablet (25 mg total) by mouth daily.  90 tablet  3  . hydrocortisone (ANUSOL-HC) 25 MG suppository Place 1 suppository (25 mg total) rectally 2 (two) times daily.  12 suppository  3  . hydrocortisone 2.5 % cream APPLY TOPICALLY 2 TIMES DAILY.  30 g  0  . nitroGLYCERIN (NITROSTAT) 0.4 MG SL tablet Place 1 tablet (0.4 mg total) under the tongue every 5 (five) minutes as needed for chest pain.  60 tablet  2  . omeprazole (PRILOSEC) 40 MG capsule Take 1 capsule (40 mg total) by mouth daily.  30 capsule  1  . predniSONE (DELTASONE) 20 MG tablet 4  TABLETS BY MOUTH DAILY FOR 2 WEEKS, THEN THE DOSE WILL BE TAPERED GRADUALLY  118 tablet  0   No current facility-administered medications for this visit.    REVIEW OF SYSTEMS:  A comprehensive review of systems was negative.   PHYSICAL EXAMINATION: General appearance: alert, cooperative and no distress Head: Normocephalic, without obvious abnormality, atraumatic Neck: no adenopathy, no JVD, supple, symmetrical, trachea midline and thyroid not enlarged, symmetric, no tenderness/mass/nodules Lymph nodes: Cervical, supraclavicular, and axillary nodes normal. Resp: clear to auscultation bilaterally Back: symmetric, no curvature. ROM normal. No CVA tenderness. Cardio: regular rate and rhythm, S1, S2 normal, no murmur, click, rub or gallop GI: soft, non-tender; bowel sounds normal; no masses,  no organomegaly Extremities: extremities normal, atraumatic, no cyanosis or edema  ECOG PERFORMANCE STATUS: 1 - Symptomatic but completely ambulatory  Blood pressure 159/89, pulse 79, temperature 98.2 F (36.8 C), temperature source Oral, resp. rate 18, height 5\' 5"  (1.651 m), weight 177 lb 6.4 oz (80.468 kg), SpO2 100.00%.  LABORATORY DATA: Lab Results  Component Value Date   WBC 6.6 01/29/2014   HGB 15.8 01/29/2014   HCT 46.9 01/29/2014   MCV 85.6 01/29/2014   PLT 28* 01/29/2014      Chemistry      Component Value Date/Time   NA 141 01/29/2014 1323   NA 136 01/25/2014 1002   K 4.0 01/29/2014 1323   K 3.5 01/25/2014 1002   CL 97 01/25/2014 1002   CO2 27 01/29/2014 1323   CO2 30 01/25/2014 1002   BUN 16.9 01/29/2014 1323   BUN  15 01/25/2014 1002   CREATININE 0.8 01/29/2014 1323   CREATININE 0.73 01/25/2014 1002      Component Value Date/Time   CALCIUM 9.0 01/29/2014 1323   CALCIUM 8.5 01/25/2014 1002   ALKPHOS 81 01/29/2014 1323   ALKPHOS 61 01/25/2014 1002   AST 33 01/29/2014 1323   AST 14 01/25/2014 1002   ALT 63* 01/29/2014 1323   ALT 61* 01/25/2014 1002   BILITOT 0.88 01/29/2014 1323   BILITOT 0.9 01/25/2014 1002        RADIOGRAPHIC STUDIES:  ASSESSMENT AND PLAN: This is a very pleasant 53 years old African male with recently diagnosed likely to be secondary to his recent infection with HIV versus idiopathic thrombocytopenic purpura. He has initial good response to the treatment with prednisone but his platelets count declined again. I have a lengthy discussion with the patient about his current condition and recommended for him to follow closely with Dr. Dwyane Dee for treatment of HIV which may result in improvement in his platelets count eventually. If no improvement in his condition I may consider the patient for other treatment options including IVIG for single agent Rituxan. I would see him back for followup visit in one month for reevaluation. He was advised to call immediately if he has any concerning symptoms in the interval.  The patient voices understanding of current disease status and treatment options and is in agreement with the current care plan.  All questions were answered. The patient knows to call the clinic with any problems, questions or concerns. We can certainly see the patient much sooner if necessary.  Disclaimer: This note was dictated with voice recognition software. Similar sounding words can inadvertently be transcribed and may not be corrected upon review.

## 2014-01-29 NOTE — Telephone Encounter (Signed)
gve the pt his June 2015 appt calendar. °

## 2014-02-01 LAB — HLA B*5701: HLA-B*5701 w/rflx HLA-B High: NEGATIVE

## 2014-02-01 NOTE — Progress Notes (Signed)
Patient is a refugee from [place] and speaks Tigrinya through an interpreter [place].   This is the interpreter's first HIV positive translation and he is very timid with asking the intake questions.  Health Department referral from refugee physical.  There is no docucmentation of HIV test done through Immigration Physical.  Patient states he was negative 6 years ago and does not understand how he could be positive. He gives history of sex with two women one is his wife and the other was a woman who "wanted a baby".   He has been married for  5 months but with this wife for 6 years and they have a one year old child.  He states the wife has been tested and is positive but the child is negative. His wife tested in [place] after he notified her of his positive test.  Pt cried throughout intake and was not able to give much information requested during intake.  He was taken to Max,(counselor) to speak with him to offer some form of immediate assistance with his anxiety.  Vaccines up to date.   Laverle Patter, RN

## 2014-02-08 LAB — HIV-1 GENOTYPR PLUS

## 2014-02-14 ENCOUNTER — Telehealth: Payer: Self-pay

## 2014-02-14 NOTE — Telephone Encounter (Signed)
Language resources will need to reschedule pateint's appointment due to interpreter not available on scheduled date and time.  Appointment rescheduled. They will inform patient.    Laverle Patter, RN

## 2014-02-15 ENCOUNTER — Ambulatory Visit: Payer: Medicaid Other | Admitting: Internal Medicine

## 2014-02-27 ENCOUNTER — Ambulatory Visit (INDEPENDENT_AMBULATORY_CARE_PROVIDER_SITE_OTHER): Payer: Medicaid Other | Admitting: Internal Medicine

## 2014-02-27 ENCOUNTER — Encounter: Payer: Self-pay | Admitting: Internal Medicine

## 2014-02-27 VITALS — BP 161/81 | HR 77 | Temp 98.2°F | Ht 65.0 in | Wt 182.5 lb

## 2014-02-27 DIAGNOSIS — I10 Essential (primary) hypertension: Secondary | ICD-10-CM

## 2014-02-27 DIAGNOSIS — Z23 Encounter for immunization: Secondary | ICD-10-CM

## 2014-02-27 DIAGNOSIS — E669 Obesity, unspecified: Secondary | ICD-10-CM

## 2014-02-27 DIAGNOSIS — B20 Human immunodeficiency virus [HIV] disease: Secondary | ICD-10-CM

## 2014-02-27 DIAGNOSIS — E785 Hyperlipidemia, unspecified: Secondary | ICD-10-CM

## 2014-02-27 MED ORDER — ELVITEG-COBIC-EMTRICIT-TENOFDF 150-150-200-300 MG PO TABS: 1.0000 | ORAL_TABLET | Freq: Every day | ORAL | Status: DC

## 2014-02-27 NOTE — Progress Notes (Signed)
Patient ID: Isaiah Mclaughlin, male   DOB: 04-Dec-1960, 53 y.o.   MRN: [id number]         [facility]  Reason for Consult: Newly diagnosed HIV infection Referring Physician: Dr. Curt Mclaughlin  Patient Active Problem List   Diagnosis Date Noted  . Human immunodeficiency virus (HIV) disease 01/26/2014    Priority: High  . HTN (hypertension) 02/27/2014  . Dyslipidemia 02/27/2014  . Obesity 02/27/2014  . Thrombocytopenia, unspecified 12/27/2013    Patient's Medications  New Prescriptions   ELVITEGRAVIR-COBICISTAT-EMTRICITABINE-TENOFOVIR (STRIBILD) 150-150-200-300 MG TABS TABLET    Take 1 tablet by mouth daily with breakfast.  Previous Medications   HYDROCHLOROTHIAZIDE (HYDRODIURIL) 25 MG TABLET    Take 1 tablet (25 mg total) by mouth daily.   HYDROCORTISONE (ANUSOL-HC) 25 MG SUPPOSITORY    Place 1 suppository (25 mg total) rectally 2 (two) times daily.   HYDROCORTISONE 2.5 % CREAM    APPLY TOPICALLY 2 TIMES DAILY.   NITROGLYCERIN (NITROSTAT) 0.4 MG SL TABLET    Place 1 tablet (0.4 mg total) under the tongue every 5 (five) minutes as needed for chest pain.   OMEPRAZOLE (PRILOSEC) 40 MG CAPSULE    Take 1 capsule (40 mg total) by mouth daily.   PREDNISONE (DELTASONE) 20 MG TABLET    4 TABLETS BY MOUTH DAILY FOR 2 WEEKS, THEN THE DOSE WILL BE TAPERED GRADUALLY  Modified Medications   No medications on file  Discontinued Medications   No medications on file    Recommendations: 1. Start Stribild once daily with food 2. Complete hepatitis B vaccination series 3. HIV education   Assessment: Isaiah Mclaughlin has newly diagnosed HIV infection. He has moderate reduction in his CD4 count. His thrombocytopenia is probably due to HIV infection and may respond to antiretroviral therapy. Her basic education about HIV infection. He seems motivated to do whatever he needs to do to stay healthy. He has Medicaid to help him obtain antiretroviral therapy but his lack of  transportation and potential work schedule may make it difficult for him to keep followup visits. I talked to him about treatment options and will start him on Stribild.  HPI: Isaiah Mclaughlin is a 53 y.o. male refugee from [place] who came to [place] about 4 months ago. He was seen for his initial primary care evaluation he was found to have thrombocytopenia. He was referred to Dr. Julien Mclaughlin for a hematology evaluation and was tested for HIV infection and found to be positive. He states that he was tested about 2 years ago and was told it was negative. Since learning of his infection his wife, who is in [place], was tested and found to be positive as well. His son is negative. He is not sure how he became infected. He states that he has had multiple male sexual contacts. He denies other risk factors. He denies having had any other sexually transmitted diseases that he knows of.  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 He is supposed to be on hydrochlorothiazide for hypertension but states that he is not on any medications. He has not had any followup visits for primary care. He states that he is under pressure or to find work quickly to help with his rent. He says that he may get a job working at a [employer] fund[employer] and is worried that he will not be able to keep medical visits. He states that he will probably be working  from 7 AM to 7 PM 6 days a week. He currently has no transportation.  His visit today was conducted with the interpreter present.  Review of Systems: Constitutional: negative Eyes: uses reading glasses Ears, nose, mouth, throat, and face: negative Respiratory: negative Cardiovascular: records indicate that he has a history of chest pain but he denies this Gastrointestinal: negative Genitourinary:negative    Past Medical History  Diagnosis Date  . HIV infection   . Hyperlipidemia   . Hypertension      History  Substance Use Topics  . Smoking status: Never Smoker   . Smokeless tobacco: Not on file  . Alcohol Use: No    Family History  Problem Relation Age of Onset  . Hypertension Mother    No Known Allergies  OBJECTIVE: Blood pressure 161/81, pulse 77, temperature 98.2 F (36.8 C), temperature source Oral, height 5\' 5"  (1.651 m), weight 182 lb 8 oz (82.781 kg). General: He is alert and in no distress Skin: No generalized rash but he does have some dry skin on his right foot Lymph nodes: No palpable adenopathy Oral: His teeth are in good condition he has no oropharyngeal lesions Eyes: Normal external exam Lungs: Clear Cor:  regular S1 and S2 no murmurs Abdomen:  obese, soft and nontender joints and extremities: Normal Mood and affect: Normal  Microbiology: No results found for this or any previous visit (from the past 240 hour(s)).  Isaiah Bickers, MD [facility] for Infectious [place] [facility] [phone] pager   [phone] cell 02/27/2014, 10:13 AM

## 2014-03-06 ENCOUNTER — Other Ambulatory Visit: Payer: Medicaid Other

## 2014-03-06 ENCOUNTER — Ambulatory Visit: Payer: Medicaid Other | Admitting: Internal Medicine

## 2014-03-06 ENCOUNTER — Other Ambulatory Visit: Payer: Self-pay | Admitting: *Deleted

## 2014-03-07 ENCOUNTER — Telehealth: Payer: Self-pay | Admitting: Internal Medicine

## 2014-03-07 NOTE — Telephone Encounter (Signed)
pt called to r/s missed appt....done....pt ok adn aware of new d.t °

## 2014-03-12 ENCOUNTER — Encounter: Payer: Self-pay | Admitting: Internal Medicine

## 2014-03-14 ENCOUNTER — Encounter: Payer: Self-pay | Admitting: Internal Medicine

## 2014-03-14 ENCOUNTER — Ambulatory Visit (INDEPENDENT_AMBULATORY_CARE_PROVIDER_SITE_OTHER): Payer: Medicaid Other | Admitting: Internal Medicine

## 2014-03-14 VITALS — BP 131/84 | HR 65 | Temp 98.4°F | Wt 181.0 lb

## 2014-03-14 DIAGNOSIS — B20 Human immunodeficiency virus [HIV] disease: Secondary | ICD-10-CM

## 2014-03-14 NOTE — Progress Notes (Signed)
Patient ID: Isaiah Mclaughlin, male   DOB: 29-Oct-1960, 53 y.o.   MRN: [id number]          Patient Active Problem List   Diagnosis Date Noted  . Human immunodeficiency virus (HIV) disease 01/26/2014    Priority: High  . HTN (hypertension) 02/27/2014  . Dyslipidemia 02/27/2014  . Obesity 02/27/2014  . Thrombocytopenia, unspecified 12/27/2013    Patient's Medications  New Prescriptions   No medications on file  Previous Medications   ELVITEGRAVIR-COBICISTAT-EMTRICITABINE-TENOFOVIR (STRIBILD) 150-150-200-300 MG TABS TABLET    Take 1 tablet by mouth daily with breakfast.  Modified Medications   No medications on file  Discontinued Medications   HYDROCHLOROTHIAZIDE (HYDRODIURIL) 25 MG TABLET    Take 1 tablet (25 mg total) by mouth daily.   HYDROCORTISONE (ANUSOL-HC) 25 MG SUPPOSITORY    Place 1 suppository (25 mg total) rectally 2 (two) times daily.   HYDROCORTISONE 2.5 % CREAM    APPLY TOPICALLY 2 TIMES DAILY.   NITROGLYCERIN (NITROSTAT) 0.4 MG SL TABLET    Place 1 tablet (0.4 mg total) under the tongue every 5 (five) minutes as needed for chest pain.   OMEPRAZOLE (PRILOSEC) 40 MG CAPSULE    Take 1 capsule (40 mg total) by mouth daily.   PREDNISONE (DELTASONE) 20 MG TABLET    4 TABLETS BY MOUTH DAILY FOR 2 WEEKS, THEN THE DOSE WILL BE TAPERED GRADUALLY    Subjective: Isaiah Mclaughlin is in for his followup visit. He was able to start on his Stribild after his initial visit several weeks ago. He has not missed any doses and is tolerating it well. He is currently not on any other medications. He did not start his blood pressure medication because he wants to try to keep his blood pressure down through dietary modification. 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 They were willing to give him time off of work to come here today but he needs a note for each visit. 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 Review of Systems: Pertinent items are noted in HPI.  Past Medical  History  Diagnosis Date  . HIV infection   . Hyperlipidemia   . Hypertension     History  Substance Use Topics  . Smoking status: Never Smoker   . Smokeless tobacco: Not on file  . Alcohol Use: No    Family History  Problem Relation Age of Onset  . Hypertension Mother     No Known Allergies  Objective: Temp: 98.4 F (36.9 C) (06/17 0847) Temp src: Oral (06/17 0847) BP: 131/84 mmHg (06/17 0847) Pulse Rate: 65 (06/17 0847) Body mass index is 30.12 kg/(m^2).  General: He is in no distress Oral: No oropharyngeal lesions Skin: No rash Lungs: Clear Cor: Regular S1 and S2 no murmurs  Lab Results Lab Results  Component Value Date   WBC 6.6 01/29/2014   HGB 15.8 01/29/2014   HCT 46.9 01/29/2014   MCV 85.6 01/29/2014   PLT 28* 01/29/2014    Lab Results  Component Value Date   CREATININE 0.8 01/29/2014   BUN 16.9 01/29/2014   NA 141 01/29/2014   K 4.0 01/29/2014   CL 97 01/25/2014   CO2 27 01/29/2014    Lab Results  Component Value Date   ALT 63* 01/29/2014   AST 33 01/29/2014   ALKPHOS 81 01/29/2014   BILITOT 0.88 01/29/2014    Lab Results  Component Value Date   CHOL 264* 01/25/2014   HDL 70  01/25/2014   LDLCALC 164* 01/25/2014   TRIG 148 01/25/2014   CHOLHDL 3.8 01/25/2014    Lab Results HIV 1 RNA Quant (copies/mL)  Date Value  01/25/2014 244628*     CD4 T Cell Abs (/uL)  Date Value  01/25/2014 210*     Assessment: He is off to a good start with his antiretroviral treatment. I instructed him to take his Stribild with food.  Plan: 1. Continue Stribild 2. Followup for blood work in one month   Michel Bickers, MD [facility] for [place] [phone] pager   [phone] cell 03/14/2014, 9:31 AM

## 2014-03-20 ENCOUNTER — Ambulatory Visit (HOSPITAL_BASED_OUTPATIENT_CLINIC_OR_DEPARTMENT_OTHER): Payer: Medicaid Other | Admitting: Internal Medicine

## 2014-03-20 ENCOUNTER — Encounter: Payer: Self-pay | Admitting: Internal Medicine

## 2014-03-20 ENCOUNTER — Ambulatory Visit
Admission: RE | Admit: 2014-03-20 | Discharge: 2014-03-20 | Disposition: A | Discharge status: Home / Self Care | Payer: No Typology Code available for payment source | Source: Ambulatory Visit | Attending: Infectious Disease | Admitting: Infectious Disease

## 2014-03-20 ENCOUNTER — Other Ambulatory Visit (HOSPITAL_BASED_OUTPATIENT_CLINIC_OR_DEPARTMENT_OTHER): Payer: Medicaid Other

## 2014-03-20 ENCOUNTER — Other Ambulatory Visit: Payer: Self-pay | Admitting: Infectious Disease

## 2014-03-20 VITALS — BP 139/71 | HR 81 | Temp 98.4°F | Resp 18 | Ht 65.0 in | Wt 181.1 lb

## 2014-03-20 DIAGNOSIS — D696 Thrombocytopenia, unspecified: Secondary | ICD-10-CM

## 2014-03-20 DIAGNOSIS — A150 Tuberculosis of lung: Secondary | ICD-10-CM

## 2014-03-20 DIAGNOSIS — B20 Human immunodeficiency virus [HIV] disease: Secondary | ICD-10-CM

## 2014-03-20 LAB — CBC WITH DIFFERENTIAL/PLATELET
BASO%: 0.6 % (ref 0.0–2.0)
Basophils Absolute: 0 10*3/uL (ref 0.0–0.1)
EOS%: 4.6 % (ref 0.0–7.0)
Eosinophils Absolute: 0.3 10*3/uL (ref 0.0–0.5)
HCT: 44.5 % (ref 38.4–49.9)
HGB: 15 g/dL (ref 13.0–17.1)
LYMPH%: 36.8 % (ref 14.0–49.0)
MCH: 29.2 pg (ref 27.2–33.4)
MCHC: 33.6 g/dL (ref 32.0–36.0)
MCV: 86.7 fL (ref 79.3–98.0)
MONO#: 0.6 10*3/uL (ref 0.1–0.9)
MONO%: 8.9 % (ref 0.0–14.0)
NEUT#: 3.1 10*3/uL (ref 1.5–6.5)
NEUT%: 49.1 % (ref 39.0–75.0)
PLATELETS: 123 10*3/uL — AB (ref 140–400)
RBC: 5.13 10*6/uL (ref 4.20–5.82)
RDW: 14.6 % (ref 11.0–14.6)
WBC: 6.3 10*3/uL (ref 4.0–10.3)
lymph#: 2.3 10*3/uL (ref 0.9–3.3)

## 2014-03-20 LAB — LACTATE DEHYDROGENASE (CC13): LDH: 193 U/L (ref 125–245)

## 2014-03-20 NOTE — Progress Notes (Signed)
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 Significantly improved. 2) recently diagnosed HIV under the care of Dr. Linus Salmons.  PRIOR THERAPY: Previous course of prednisone with initial improvement in platelets count then declined again.  CURRENT THERAPY: None  INTERVAL HISTORY: Isaiah Mclaughlin 53 y.o. male returns to the clinic today for followup visit. The patient is feeling fine today with no specific complaints except for mild fatigue from his HIV medications. He is followed by Dr. Linus Salmons for treatment of HIV. He denied having any significant bleeding issues, bruises or ecchymosis. The patient denied having any significant heartburn, nausea or vomiting. He denied having any chest pain, shortness of breath, cough or hemoptysis. The patient has repeat CBC performed and he is here for evaluation and discussion of his lab results.  MEDICAL HISTORY: Past Medical History  Diagnosis Date  . HIV infection   . Hyperlipidemia   . Hypertension     ALLERGIES:  has No Known Allergies.  MEDICATIONS:  Current Outpatient Prescriptions  Medication Sig Dispense Refill  . elvitegravir-cobicistat-emtricitabine-tenofovir (STRIBILD) 150-150-200-300 MG TABS tablet Take 1 tablet by mouth daily with breakfast.  30 tablet  11   No current facility-administered medications for this visit.    REVIEW OF SYSTEMS:  A comprehensive review of systems was negative.   PHYSICAL EXAMINATION: General appearance: alert, cooperative and no distress Head: Normocephalic, without obvious abnormality, atraumatic Neck: no adenopathy, no JVD, supple, symmetrical, trachea midline and thyroid not enlarged, symmetric, no tenderness/mass/nodules Lymph nodes: Cervical, supraclavicular, and axillary nodes normal. Resp: clear to  auscultation bilaterally Back: symmetric, no curvature. ROM normal. No CVA tenderness. Cardio: regular rate and rhythm, S1, S2 normal, no murmur, click, rub or gallop GI: soft, non-tender; bowel sounds normal; no masses,  no organomegaly Extremities: extremities normal, atraumatic, no cyanosis or edema  ECOG PERFORMANCE STATUS: 1 - Symptomatic but completely ambulatory  Blood pressure 139/71, pulse 81, temperature 98.4 F (36.9 C), temperature source Oral, resp. rate 18, height 5\' 5"  (1.651 m), weight 181 lb 1.6 oz (82.146 kg), SpO2 100.00%.  LABORATORY DATA: Lab Results  Component Value Date   WBC 6.3 03/20/2014   HGB 15.0 03/20/2014   HCT 44.5 03/20/2014   MCV 86.7 03/20/2014   PLT 123* 03/20/2014      Chemistry      Component Value Date/Time   NA 141 01/29/2014 1323   NA 136 01/25/2014 1002   K 4.0 01/29/2014 1323   K 3.5 01/25/2014 1002   CL 97 01/25/2014 1002   CO2 27 01/29/2014 1323   CO2 30 01/25/2014 1002   BUN 16.9 01/29/2014 1323   BUN 15 01/25/2014 1002   CREATININE 0.8 01/29/2014 1323   CREATININE 0.73 01/25/2014 1002      Component Value Date/Time   CALCIUM 9.0 01/29/2014 1323   CALCIUM 8.5 01/25/2014 1002   ALKPHOS 81 01/29/2014 1323   ALKPHOS 61 01/25/2014 1002   AST 33 01/29/2014 1323   AST 14 01/25/2014 1002   ALT 63* 01/29/2014 1323   ALT 61* 01/25/2014 1002   BILITOT 0.88 01/29/2014 1323   BILITOT 0.9 01/25/2014 1002       RADIOGRAPHIC STUDIES:  ASSESSMENT AND PLAN: This is a very pleasant 53 years old African male with recently diagnosed likely to be secondary to his recent infection with HIV.  He  has significant improvement in his platelets count once he started treatment for HIV. I discussed the lab result with the patient today. I recommended for him to continue his routine followup visit with the infectious disease clinic and to take his medication as prescribed by Dr. Linus Salmons. I don't see a need for the patient to continue routine followup visit with me at this point but  will be happy to see in the future if needed. He was advised to call immediately if he has any concerning symptoms.  The patient voices understanding of current disease status and treatment options and is in agreement with the current care plan.  All questions were answered. The patient knows to call the clinic with any problems, questions or concerns. We can certainly see the patient much sooner if necessary.  Disclaimer: This note was dictated with voice recognition software. Similar sounding words can inadvertently be transcribed and may not be corrected upon review.

## 2014-03-20 NOTE — Progress Notes (Signed)
Patient does not speak english.  Unable to successfully complete a collaborative assessment.  Dr Vista Mink can communicate with patient.

## 2014-04-17 ENCOUNTER — Ambulatory Visit (INDEPENDENT_AMBULATORY_CARE_PROVIDER_SITE_OTHER): Payer: Medicaid Other | Admitting: Internal Medicine

## 2014-04-17 ENCOUNTER — Other Ambulatory Visit: Payer: Self-pay | Admitting: Internal Medicine

## 2014-04-17 VITALS — BP 133/77 | HR 73 | Temp 97.8°F | Wt 181.2 lb

## 2014-04-17 DIAGNOSIS — B20 Human immunodeficiency virus [HIV] disease: Secondary | ICD-10-CM

## 2014-04-17 LAB — BASIC METABOLIC PANEL
BUN: 12 mg/dL (ref 6–23)
CALCIUM: 8.7 mg/dL (ref 8.4–10.5)
CO2: 26 mEq/L (ref 19–32)
Chloride: 105 mEq/L (ref 96–112)
Creat: 0.7 mg/dL (ref 0.50–1.35)
Glucose, Bld: 100 mg/dL — ABNORMAL HIGH (ref 70–99)
Potassium: 4.4 mEq/L (ref 3.5–5.3)
Sodium: 138 mEq/L (ref 135–145)

## 2014-04-17 NOTE — Progress Notes (Signed)
Patient ID: Isaiah Mclaughlin, male   DOB: 1960-11-19, 53 y.o.   MRN: [id number]          Patient Active Problem List   Diagnosis Date Noted  . Human immunodeficiency virus (HIV) disease 01/26/2014    Priority: High  . HTN (hypertension) 02/27/2014  . Dyslipidemia 02/27/2014  . Obesity 02/27/2014  . Thrombocytopenia, unspecified 12/27/2013    Patient's Medications  New Prescriptions   No medications on file  Previous Medications   ELVITEGRAVIR-COBICISTAT-EMTRICITABINE-TENOFOVIR (STRIBILD) 150-150-200-300 MG TABS TABLET    Take 1 tablet by mouth daily with breakfast.  Modified Medications   No medications on file  Discontinued Medications   No medications on file    Subjective: Isaiah Mclaughlin is in for his routine followup visit with his interpreter. He denies missing any doses of his Triavil. He denies complaints. He is still working at the [employer].  Review of Systems: Pertinent items are noted in HPI.  Past Medical History  Diagnosis Date  . HIV infection   . Hyperlipidemia   . Hypertension     History  Substance Use Topics  . Smoking status: Never Smoker   . Smokeless tobacco: Not on file  . Alcohol Use: No    Family History  Problem Relation Age of Onset  . Hypertension Mother     No Known Allergies  Objective: Temp: 97.8 F (36.6 C) (07/21 0841) Temp src: Oral (07/21 0841) BP: 133/77 mmHg (07/21 0841) Pulse Rate: 73 (07/21 0841) Body mass index is 30.15 kg/(m^2).  General: He is in no distress Oral: No oropharyngeal lesions Lungs: Clear Cor: Regular S1 and S2 and no murmurs  Lab Results Lab Results  Component Value Date   WBC 6.3 03/20/2014   HGB 15.0 03/20/2014   HCT 44.5 03/20/2014   MCV 86.7 03/20/2014   PLT 123* 03/20/2014    Lab Results  Component Value Date   CREATININE 0.8 01/29/2014   BUN 16.9 01/29/2014   NA 141 01/29/2014   K 4.0 01/29/2014   CL 97 01/25/2014   CO2 27 01/29/2014    Lab Results  Component Value Date   ALT  63* 01/29/2014   AST 33 01/29/2014   ALKPHOS 81 01/29/2014   BILITOT 0.88 01/29/2014    Lab Results  Component Value Date   CHOL 264* 01/25/2014   HDL 70 01/25/2014   LDLCALC 164* 01/25/2014   TRIG 148 01/25/2014   CHOLHDL 3.8 01/25/2014    Lab Results HIV 1 RNA Quant (copies/mL)  Date Value  01/25/2014 216244*     CD4 T Cell Abs (/uL)  Date Value  01/25/2014 210*     Assessment: His adherence is very good.  Plan: 1. Continue Stribild 2. Repeat lab work today   Michel Bickers, [place] [facility] [phone] pager   [phone] cell 04/17/2014, 8:52 AM

## 2014-04-18 LAB — T-HELPER CELL (CD4) - (RCID CLINIC ONLY)
CD4 % Helper T Cell: 12 % — ABNORMAL LOW (ref 33–55)
CD4 T CELL ABS: 260 /uL — AB (ref 400–2700)

## 2014-04-21 LAB — HIV-1 RNA QUANT-NO REFLEX-BLD
HIV 1 RNA Quant: 45 copies/mL — ABNORMAL HIGH (ref <20)
HIV-1 RNA QUANT, LOG: 1.65 {Log} — AB (ref <1.30)

## 2014-05-15 ENCOUNTER — Ambulatory Visit (INDEPENDENT_AMBULATORY_CARE_PROVIDER_SITE_OTHER): Payer: Medicaid Other | Admitting: Internal Medicine

## 2014-05-15 ENCOUNTER — Encounter: Payer: Self-pay | Admitting: Internal Medicine

## 2014-05-15 VITALS — BP 128/81 | HR 65 | Temp 97.7°F | Wt 180.5 lb

## 2014-05-15 DIAGNOSIS — B20 Human immunodeficiency virus [HIV] disease: Secondary | ICD-10-CM

## 2014-05-15 NOTE — Progress Notes (Signed)
Patient ID: Isaiah Mclaughlin, male   DOB: [birth year], 53 y.o.   MRN: [id number]          Patient Active Problem List   Diagnosis Date Noted  . Human immunodeficiency virus (HIV) disease 01/26/2014    Priority: High  . HTN (hypertension) 02/27/2014  . Dyslipidemia 02/27/2014  . Obesity 02/27/2014  . Thrombocytopenia, unspecified 12/27/2013    Patient's Medications  New Prescriptions   No medications on file  Previous Medications   ELVITEGRAVIR-COBICISTAT-EMTRICITABINE-TENOFOVIR (STRIBILD) 150-150-200-300 MG TABS TABLET    Take 1 tablet by mouth daily with breakfast.  Modified Medications   No medications on file  Discontinued Medications   No medications on file    Subjective: Isaiah Mclaughlin is in for his routine visit. He has only missed one dose of Stribild, when his pharmacy was late with his refill. He is feeling well Review of Systems: Pertinent items are noted in HPI.  Past Medical History  Diagnosis Date  . HIV infection   . Hyperlipidemia   . Hypertension     History  Substance Use Topics  . Smoking status: Never Smoker   . Smokeless tobacco: Not on file  . Alcohol Use: No    Family History  Problem Relation Age of Onset  . Hypertension Mother     No Known Allergies  Objective: Temp: 97.7 F (36.5 C) (08/18 0852) Temp src: Oral (08/18 0852) BP: 128/81 mmHg (08/18 0852) Pulse Rate: 65 (08/18 0852) Body mass index is 30.04 kg/(m^2).  General: He is in good spirits Oral: No oropharyngeal lesions Skin: No rash Lungs: Clear Cor: Regular S1 and S2 with no murmurs  Lab Results Lab Results  Component Value Date   WBC 6.3 03/20/2014   HGB 15.0 03/20/2014   HCT 44.5 03/20/2014   MCV 86.7 03/20/2014   PLT 123* 03/20/2014    Lab Results  Component Value Date   CREATININE 0.70 04/17/2014   BUN 12 04/17/2014   NA 138 04/17/2014   K 4.4 04/17/2014   CL 105 04/17/2014   CO2 26 04/17/2014    Lab Results  Component Value Date   ALT 63* 01/29/2014   AST 33  01/29/2014   ALKPHOS 81 01/29/2014   BILITOT 0.88 01/29/2014    Lab Results  Component Value Date   CHOL 264* 01/25/2014   HDL 70 01/25/2014   LDLCALC 164* 01/25/2014   TRIG 148 01/25/2014   CHOLHDL 3.8 01/25/2014    Lab Results HIV 1 RNA Quant (copies/mL)  Date Value  04/17/2014 45*  01/25/2014 [id number]*     CD4 T Cell Abs (/uL)  Date Value  04/17/2014 260*  01/25/2014 210*     Assessment: His HIV infection is coming under very good control.  Plan: 1. Continue Stribild 2. Followup in 3 months   Michel Bickers, MD [facility] for [place] [phone] pager   [phone] cell 05/15/2014, 9:09 AM

## 2014-08-21 ENCOUNTER — Ambulatory Visit (INDEPENDENT_AMBULATORY_CARE_PROVIDER_SITE_OTHER): Payer: BLUE CROSS/BLUE SHIELD | Admitting: Internal Medicine

## 2014-08-21 ENCOUNTER — Ambulatory Visit: Payer: Self-pay

## 2014-08-21 ENCOUNTER — Encounter: Payer: Self-pay | Admitting: Internal Medicine

## 2014-08-21 VITALS — BP 153/83 | HR 60 | Temp 97.5°F | Wt 185.2 lb

## 2014-08-21 DIAGNOSIS — Z23 Encounter for immunization: Secondary | ICD-10-CM | POA: Diagnosis not present

## 2014-08-21 DIAGNOSIS — B20 Human immunodeficiency virus [HIV] disease: Secondary | ICD-10-CM

## 2014-08-21 NOTE — Progress Notes (Signed)
Patient ID: Isaiah Mclaughlin, male   DOB: 1961-06-22, 53 y.o.   MRN: [id number]          Patient Active Problem List   Diagnosis Date Noted  . Human immunodeficiency virus (HIV) disease 01/26/2014    Priority: High  . HTN (hypertension) 02/27/2014  . Dyslipidemia 02/27/2014  . Obesity 02/27/2014  . Thrombocytopenia, unspecified 12/27/2013    Patient's Medications  New Prescriptions   No medications on file  Previous Medications   ELVITEGRAVIR-COBICISTAT-EMTRICITABINE-TENOFOVIR (STRIBILD) 150-150-200-300 MG TABS TABLET    Take 1 tablet by mouth daily with breakfast.  Modified Medications   No medications on file  Discontinued Medications   No medications on file    Subjective: Isaiah Mclaughlin is in for his routine visit with the interpreter. He denies missing any doses of his Stribild since his last visit but states that he had to pay $120 for his last refill. He does have a receipt indicating that that is correct. He thinks he was told that his Medicaid is no longer active. He continues to work at the [employer]. He indicates that he has a great deal of difficulty communicating with anyone so he cannot easily look for other work. Review of Systems: Pertinent items are noted in HPI.  Past Medical History  Diagnosis Date  . HIV infection   . Hyperlipidemia   . Hypertension     History  Substance Use Topics  . Smoking status: Never Smoker   . Smokeless tobacco: Not on file  . Alcohol Use: No    Family History  Problem Relation Age of Onset  . Hypertension Mother     No Known Allergies  Objective: Temp: 97.5 F (36.4 C) (11/24 0857) Temp Source: Oral (11/24 0857) BP: 153/83 mmHg (11/24 0857) Pulse Rate: 60 (11/24 0857) Body mass index is 30.83 kg/(m^2).  General: He is in no distress Oral: No oropharyngeal lesions Skin: No rash Lungs: Clear Cor: Regular S1 and S2 with no murmurs  Lab Results Lab Results  Component Value Date   WBC 6.3  03/20/2014   HGB 15.0 03/20/2014   HCT 44.5 03/20/2014   MCV 86.7 03/20/2014   PLT 123* 03/20/2014    Lab Results  Component Value Date   CREATININE 0.70 04/17/2014   BUN 12 04/17/2014   NA 138 04/17/2014   K 4.4 04/17/2014   CL 105 04/17/2014   CO2 26 04/17/2014    Lab Results  Component Value Date   ALT 63* 01/29/2014   AST 33 01/29/2014   ALKPHOS 81 01/29/2014   BILITOT 0.88 01/29/2014    Lab Results  Component Value Date   CHOL 264* 01/25/2014   HDL 70 01/25/2014   LDLCALC 164* 01/25/2014   TRIG 148 01/25/2014   CHOLHDL 3.8 01/25/2014    Lab Results HIV 1 RNA QUANT (copies/mL)  Date Value  04/17/2014 45*  01/25/2014 170017*   CD4 T CELL ABS (/uL)  Date Value  04/17/2014 260*  01/25/2014 210*     Assessment: His infection has come under better control. I will repeat blood work today and continue Stribild. I will have him meet with our financial counselor today.  Plan: 1. Continue Stribild 2. Repeat CD4 and HIV viral load 3. Follow-up in 3 months   Michel Bickers, MD [facility] for [place] [facility] [phone] pager   [phone] cell 08/21/2014, 9:13 AM

## 2014-08-22 LAB — HIV-1 RNA QUANT-NO REFLEX-BLD

## 2014-08-22 LAB — T-HELPER CELL (CD4) - (RCID CLINIC ONLY)
CD4 % Helper T Cell: 14 % — ABNORMAL LOW (ref 33–55)
CD4 T Cell Abs: 290 /uL — ABNORMAL LOW (ref 400–2700)

## 2014-11-20 ENCOUNTER — Ambulatory Visit (INDEPENDENT_AMBULATORY_CARE_PROVIDER_SITE_OTHER): Payer: BLUE CROSS/BLUE SHIELD | Admitting: Internal Medicine

## 2014-11-20 DIAGNOSIS — B20 Human immunodeficiency virus [HIV] disease: Secondary | ICD-10-CM

## 2014-11-20 LAB — COMPREHENSIVE METABOLIC PANEL
ALBUMIN: 4.5 g/dL (ref 3.5–5.2)
ALT: 22 U/L (ref 0–53)
AST: 20 U/L (ref 0–37)
Alkaline Phosphatase: 88 U/L (ref 39–117)
BUN: 9 mg/dL (ref 6–23)
CALCIUM: 9.2 mg/dL (ref 8.4–10.5)
CHLORIDE: 101 meq/L (ref 96–112)
CO2: 28 mEq/L (ref 19–32)
CREATININE: 0.8 mg/dL (ref 0.50–1.35)
Glucose, Bld: 95 mg/dL (ref 70–99)
POTASSIUM: 4.5 meq/L (ref 3.5–5.3)
SODIUM: 138 meq/L (ref 135–145)
TOTAL PROTEIN: 7.6 g/dL (ref 6.0–8.3)
Total Bilirubin: 0.7 mg/dL (ref 0.2–1.2)

## 2014-11-20 LAB — CBC
HCT: 45.9 % (ref 39.0–52.0)
Hemoglobin: 16 g/dL (ref 13.0–17.0)
MCH: 30.7 pg (ref 26.0–34.0)
MCHC: 34.9 g/dL (ref 30.0–36.0)
MCV: 87.9 fL (ref 78.0–100.0)
MPV: 10.7 fL (ref 8.6–12.4)
Platelets: 176 10*3/uL (ref 150–400)
RBC: 5.22 MIL/uL (ref 4.22–5.81)
RDW: 13.8 % (ref 11.5–15.5)
WBC: 5.3 10*3/uL (ref 4.0–10.5)

## 2014-11-20 NOTE — Progress Notes (Signed)
Patient ID: Isaiah Mclaughlin, male   DOB: 1961/03/25, 54 y.o.   MRN: [id number]          Patient Active Problem List   Diagnosis Date Noted  . Human immunodeficiency virus (HIV) disease 01/26/2014    Priority: High  . HTN (hypertension) 02/27/2014  . Dyslipidemia 02/27/2014  . Obesity 02/27/2014  . Thrombocytopenia, unspecified 12/27/2013    Patient's Medications  New Prescriptions   No medications on file  Previous Medications   ELVITEGRAVIR-COBICISTAT-EMTRICITABINE-TENOFOVIR (STRIBILD) 150-150-200-300 MG TABS TABLET    Take 1 tablet by mouth daily with breakfast.  Modified Medications   No medications on file  Discontinued Medications   No medications on file    Subjective: Isaiah Mclaughlin is in for his routine visit without the interpreter. He believes the interpreter has been telling people in his neighborhood about his HIV infection and does not want to use the interpreter anymore. He denies missing any doses of his Stribild since his last visit but states that he is still having to pay $120 each month. He has [employer] through his work at [employer]. However he says that he is going to cancel his insurance in 3 weeks.    Review of Systems: Pertinent items are noted in HPI.  Past Medical History  Diagnosis Date  . HIV infection   . Hyperlipidemia   . Hypertension     History  Substance Use Topics  . Smoking status: Never Smoker   . Smokeless tobacco: Not on file  . Alcohol Use: No    Family History  Problem Relation Age of Onset  . Hypertension Mother     No Known Allergies  Objective: Temp: 98 F (36.7 C) (02/23 0844) Temp Source: Oral (02/23 0844) BP: 149/90 mmHg (02/23 0844) Pulse Rate: 66 (02/23 0844) Body mass index is 29.41 kg/(m^2).  General: He is in no distress Oral: No oropharyngeal lesions Skin: No rash Lungs: Clear Cor: Regular S1 and S2 with no murmurs  Lab Results Lab Results  Component Value  Date   WBC 6.3 03/20/2014   HGB 15.0 03/20/2014   HCT 44.5 03/20/2014   MCV 86.7 03/20/2014   PLT 123* 03/20/2014    Lab Results  Component Value Date   CREATININE 0.70 04/17/2014   BUN 12 04/17/2014   NA 138 04/17/2014   K 4.4 04/17/2014   CL 105 04/17/2014   CO2 26 04/17/2014    Lab Results  Component Value Date   ALT 63* 01/29/2014   AST 33 01/29/2014   ALKPHOS 81 01/29/2014   BILITOT 0.88 01/29/2014    Lab Results  Component Value Date   CHOL 264* 01/25/2014   HDL 70 01/25/2014   LDLCALC 164* 01/25/2014   TRIG 148 01/25/2014   CHOLHDL 3.8 01/25/2014    Lab Results HIV 1 RNA QUANT (copies/mL)  Date Value  08/21/2014 <20  04/17/2014 45*  01/25/2014 [id number]*   CD4 T CELL ABS (/uL)  Date Value  08/21/2014 290*  04/17/2014 260*  01/25/2014 210*     Assessment: His infection has come under better control. I will repeat blood work today and continue Stribild. I will have him meet with our financial counselor today.   Plan: 1. Continue Stribild 2. Repeat CD4 and HIV viral load 3. Follow-up in 3 months   Michel Bickers, MD [facility] for [place] [facility] [phone] pager   [phone] cell 11/20/2014, 9:02 AM

## 2014-11-20 NOTE — Progress Notes (Signed)
Patient declining the use of [place] interpreter service today.  Using his own interpreter through his phone.

## 2014-11-21 LAB — RPR

## 2014-11-21 LAB — HIV-1 RNA QUANT-NO REFLEX-BLD
HIV 1 RNA Quant: <20 copies/mL (ref <20)
HIV-1 RNA Quant, Log: <1.3 {Log} (ref <1.30)

## 2014-11-21 LAB — T-HELPER CELL (CD4) - (RCID CLINIC ONLY)
CD4 % Helper T Cell: 16 % — ABNORMAL LOW (ref 33–55)
CD4 T Cell Abs: 290 /uL — ABNORMAL LOW (ref 400–2700)

## 2015-01-01 IMAGING — US US ABDOMEN COMPLETE
1 series · 13 of 25 positions shown · non-contrast
Comparison: None.

CLINICAL DATA: Abnormal laboratory values

EXAM:
ULTRASOUND ABDOMEN COMPLETE

[Series 1: us abdomen complete · 0.27mm/px · 13 of 76 slices shown]
[im 1/76]
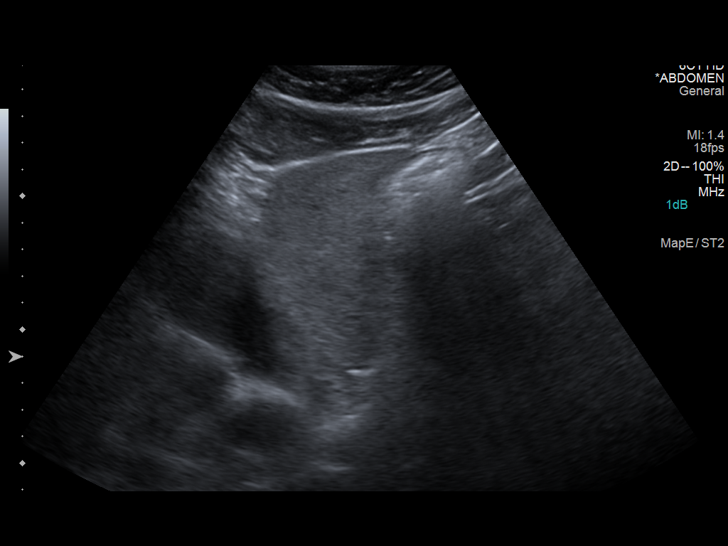
[im 7/76]
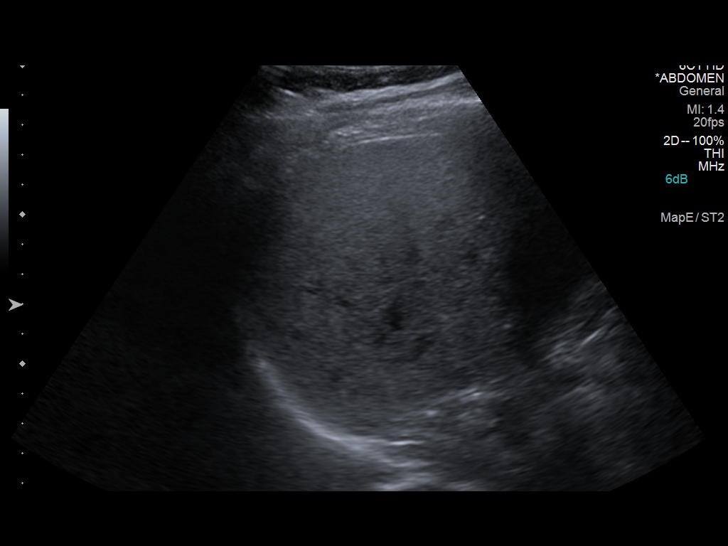
[im 13/76]
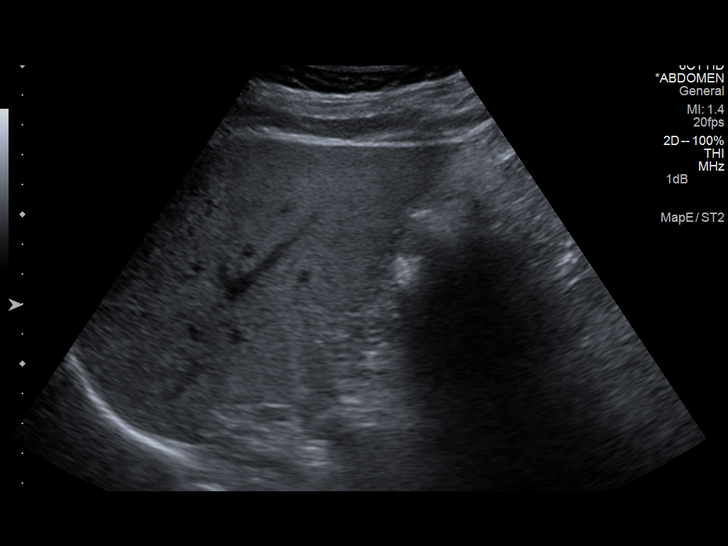
[im 19/76]
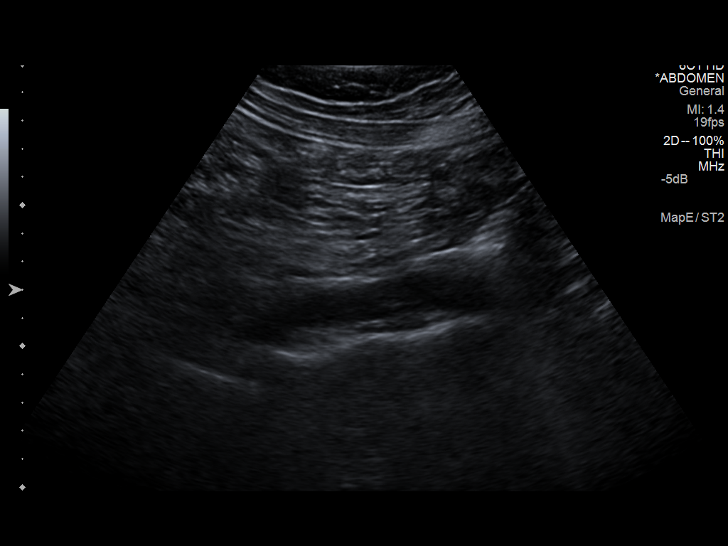
[im 26/76]
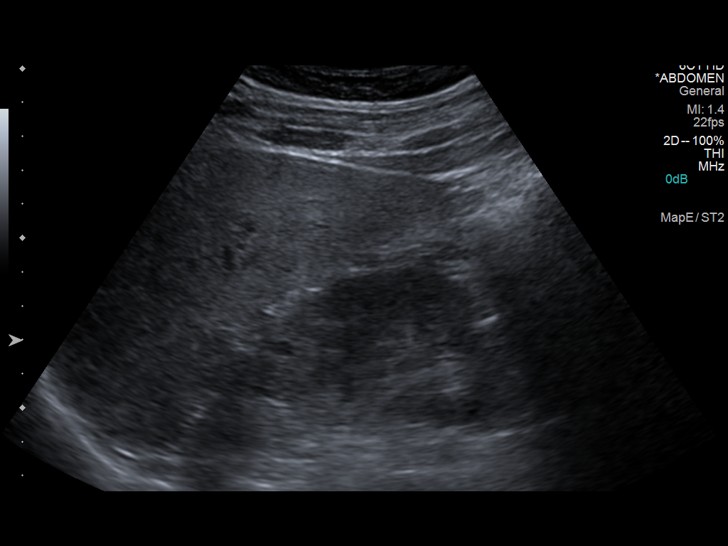
[im 32/76]
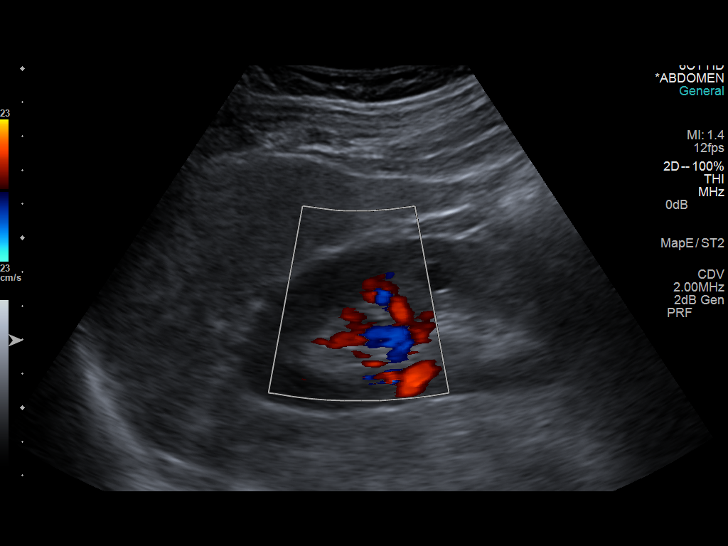
[im 38/76]
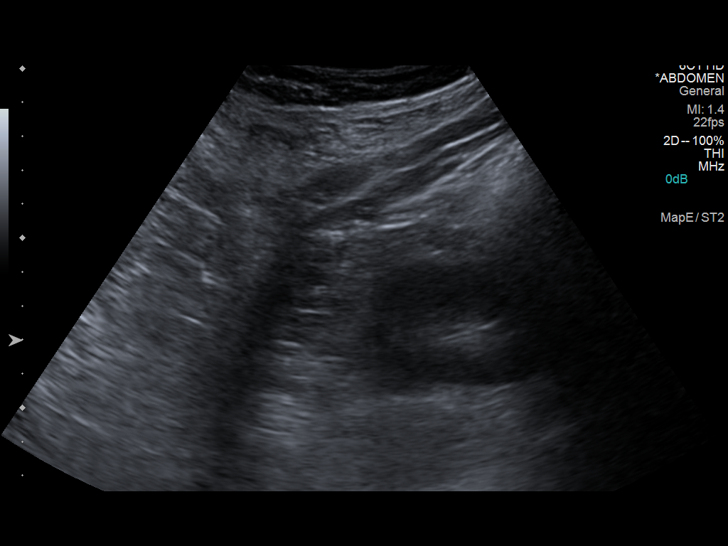
[im 44/76]
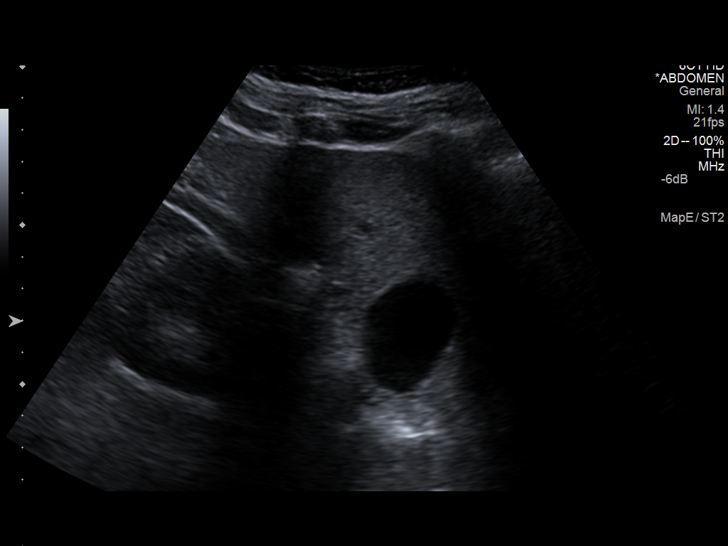
[im 51/76]
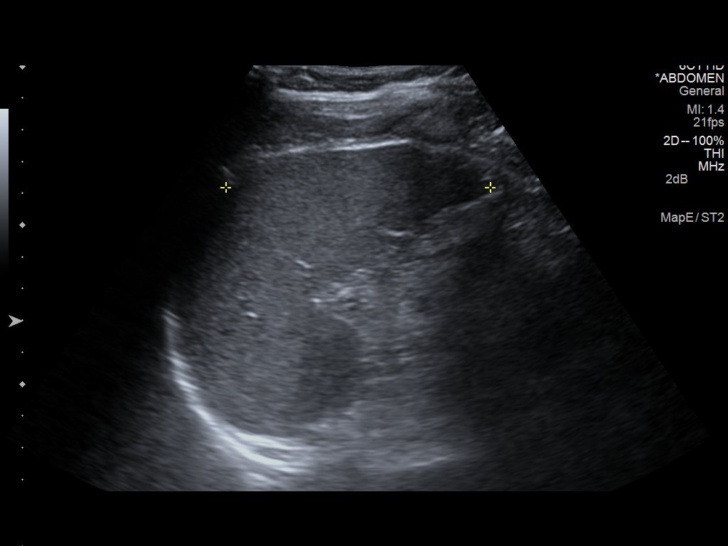
[im 57/76]
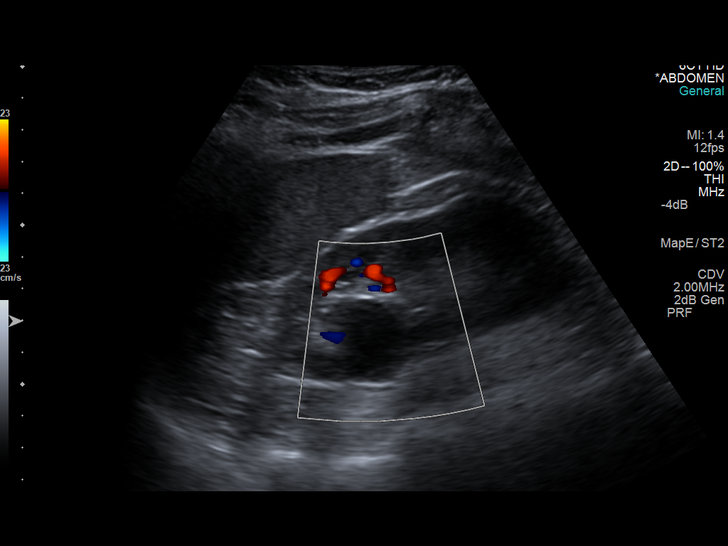
[im 63/76]
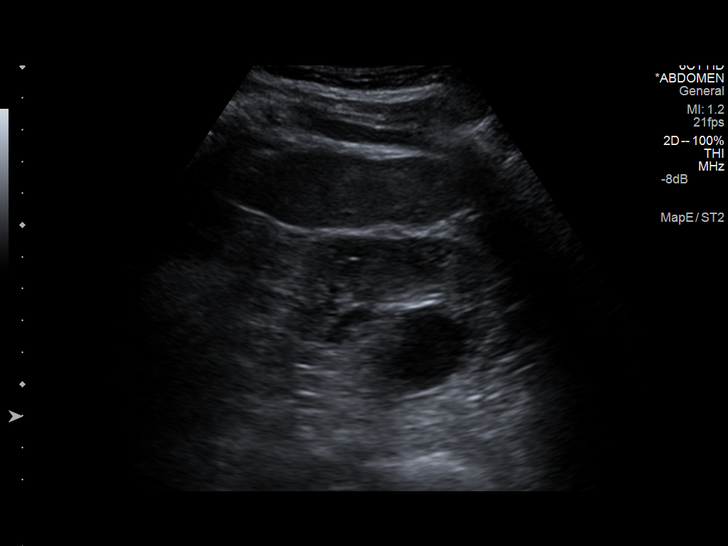
[im 69/76]
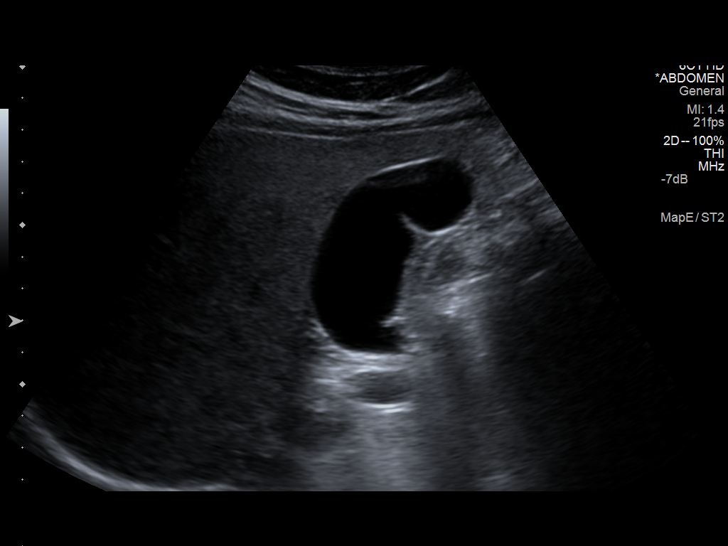
[im 76/76]
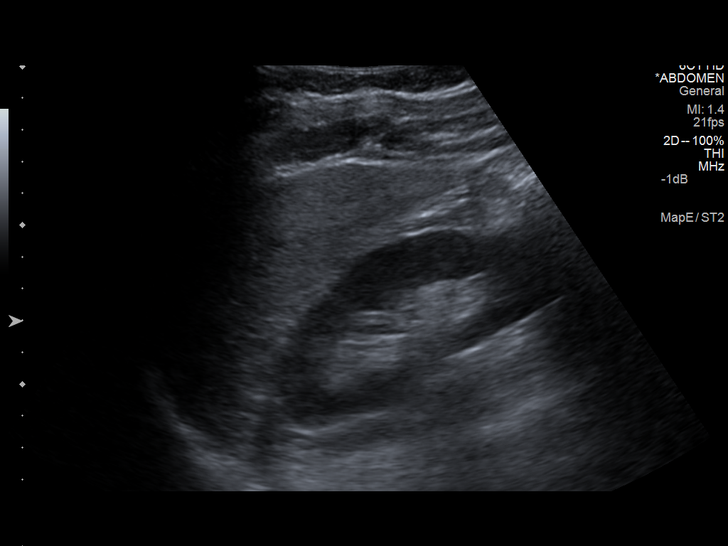

[13 of 25 positions shown; findings below may reference images not displayed]

FINDINGS: Gallbladder:

No gallstones or wall thickening visualized. No sonographic Murphy
sign noted.

Common bile duct:

Diameter: 3.2 mm

Liver:

No focal lesion identified. Within normal limits in parenchymal
echogenicity.

IVC:

No abnormality visualized.

Pancreas:

The pancreatic head and body appear normal. The pancreatic tail was
obscured by bowel gas.

Spleen:

Size and appearance within normal limits.

Right Kidney:

Length: 10.2 cm. Echogenicity within normal limits. No
hydronephrosis visualized. There is a 1 cm diameter midpole cortical
cyst.

Left Kidney:

Length: 10.5 cm. Echogenicity within normal limits. No
hydronephrosis visualized. There is a 2.9 cm cyst in the upper pole
cortex of the left kidney.

Abdominal aorta:

No aneurysm visualized.

Other findings:

None.
IMPRESSION: 1. Increased echotexture of the liver suggests fatty infiltration.
There is no focal mass or ductal dilation. The gallbladder, common
bile duct, and visualized portions of the pancreas appear normal.
2. There are simple appearing cysts in both kidneys.
3. The spleen and abdominal aorta and inferior vena cava are within
the limits of normal.

## 2015-02-19 ENCOUNTER — Ambulatory Visit (INDEPENDENT_AMBULATORY_CARE_PROVIDER_SITE_OTHER): Payer: BLUE CROSS/BLUE SHIELD | Admitting: Internal Medicine

## 2015-02-19 DIAGNOSIS — B20 Human immunodeficiency virus [HIV] disease: Secondary | ICD-10-CM | POA: Diagnosis not present

## 2015-02-19 MED ORDER — ELVITEG-COBIC-EMTRICIT-TENOFAF 150-150-200-10 MG PO TABS: 1.0000 | ORAL_TABLET | Freq: Every day | ORAL | Status: DC

## 2015-02-19 NOTE — Progress Notes (Signed)
Patient ID: Isaiah Mclaughlin, male   DOB: 1961/01/19, 54 y.o.   MRN: [id number]          Patient Active Problem List   Diagnosis Date Noted  . Human immunodeficiency virus (HIV) disease 01/26/2014    Priority: High  . HTN (hypertension) 02/27/2014  . Dyslipidemia 02/27/2014  . Obesity 02/27/2014  . Thrombocytopenia, unspecified 12/27/2013    Patient's Medications  New Prescriptions   ELVITEGRAVIR-COBICISTAT-EMTRICITABINE-TENOFOVIR (GENVOYA) 150-150-200-10 MG TABS TABLET    Take 1 tablet by mouth daily with breakfast.  Previous Medications   No medications on file  Modified Medications   No medications on file  Discontinued Medications   ELVITEGRAVIR-COBICISTAT-EMTRICITABINE-TENOFOVIR (STRIBILD) 150-150-200-300 MG TABS TABLET    Take 1 tablet by mouth daily with breakfast.    Subjective: Israel is in for his routine visit with the interpreter. He denies missing any doses of his Stribild since his last visit. He has been using a voucher to help him get his medication. He takes it each afternoon with a meal. He was referred to a doctor recently because of problems with his left thumb trigger finger and left shoulder pain. He is scheduled for left thumb surgery next month. He wants to know if he should tell his surgeon about his HIV infection. He is worried that his surgeon might tell his company that he has HIV and he could get fired. He is feeling well other than his joint problems.  Review of Systems: Pertinent items are noted in HPI.  Past Medical History  Diagnosis Date  . HIV infection   . Hyperlipidemia   . Hypertension     History  Substance Use Topics  . Smoking status: Never Smoker   . Smokeless tobacco: Not on file  . Alcohol Use: No    Family History  Problem Relation Age of Onset  . Hypertension Mother     No Known Allergies  Objective: Temp: 97.8 F (36.6 C) (05/24 0854) Temp Source: Oral (05/24 0854) BP: 143/83 mmHg (05/24 0854) Pulse Rate: 65  (05/24 0854) Body mass index is 29.62 kg/(m^2).  General: He is in no distress Oral: No oropharyngeal lesions Skin: No rash Lungs: Clear Cor: Regular S1 and S2 with no murmurs  Lab Results Lab Results  Component Value Date   WBC 5.3 11/20/2014   HGB 16.0 11/20/2014   HCT 45.9 11/20/2014   MCV 87.9 11/20/2014   PLT 176 11/20/2014    Lab Results  Component Value Date   CREATININE 0.80 11/20/2014   BUN 9 11/20/2014   NA 138 11/20/2014   K 4.5 11/20/2014   CL 101 11/20/2014   CO2 28 11/20/2014    Lab Results  Component Value Date   ALT 22 11/20/2014   AST 20 11/20/2014   ALKPHOS 88 11/20/2014   BILITOT 0.7 11/20/2014    Lab Results  Component Value Date   CHOL 264* 01/25/2014   HDL 70 01/25/2014   LDLCALC 164* 01/25/2014   TRIG 148 01/25/2014   CHOLHDL 3.8 01/25/2014    Lab Results HIV 1 RNA QUANT (copies/mL)  Date Value  11/20/2014 <20  08/21/2014 <20  04/17/2014 45*   CD4 T CELL ABS (/uL)  Date Value  11/20/2014 290*  08/21/2014 290*  04/17/2014 260*     Assessment: His infection has come under good control and he is having some slow CD4 reconstitution. I will change him to the new preparation called Genvoya. I told him that release of the information about his HIV  infection to his employer would be a breach of his privacy and that any good physician would not do that. I encouraged him to inform any physician that he sees about his HIV infection and all medications that he is currently taking.  Plan: 1. Change Stribild to Genvoya 2. Follow-up after blood work in [place] months   Michel Bickers, MD [facility] for [place] [phone] pager   [phone] cell 02/19/2015, 9:22 AM

## 2015-02-22 ENCOUNTER — Telehealth: Payer: Self-pay | Admitting: *Deleted

## 2015-02-22 NOTE — Telephone Encounter (Signed)
Genvoya approved for 6 months (good through 08/25/15).  Approval [id number].  Copay is >$360.  Per [facility], the copay card on file has expired/may have no balance remaining.  Patient due for refill of ARV on 6/1.   Notified local [facility], explained the situation.  They have the medication ready for him.  RN activated a Arts development officer coupon card and relayed the information to [employer]. Left message for patient using [employer] to call back if he has issues paying for this prescription.  Per pharmacist, his copay is now $0. Landis Gandy, RN

## 2015-03-26 IMAGING — CR DG CHEST 1V
1 series · 1 of 1 positions shown · non-contrast
Comparison: None.

CLINICAL DATA: Positive TB test

EXAM:
CHEST - 1 VIEW

[w chest pa]
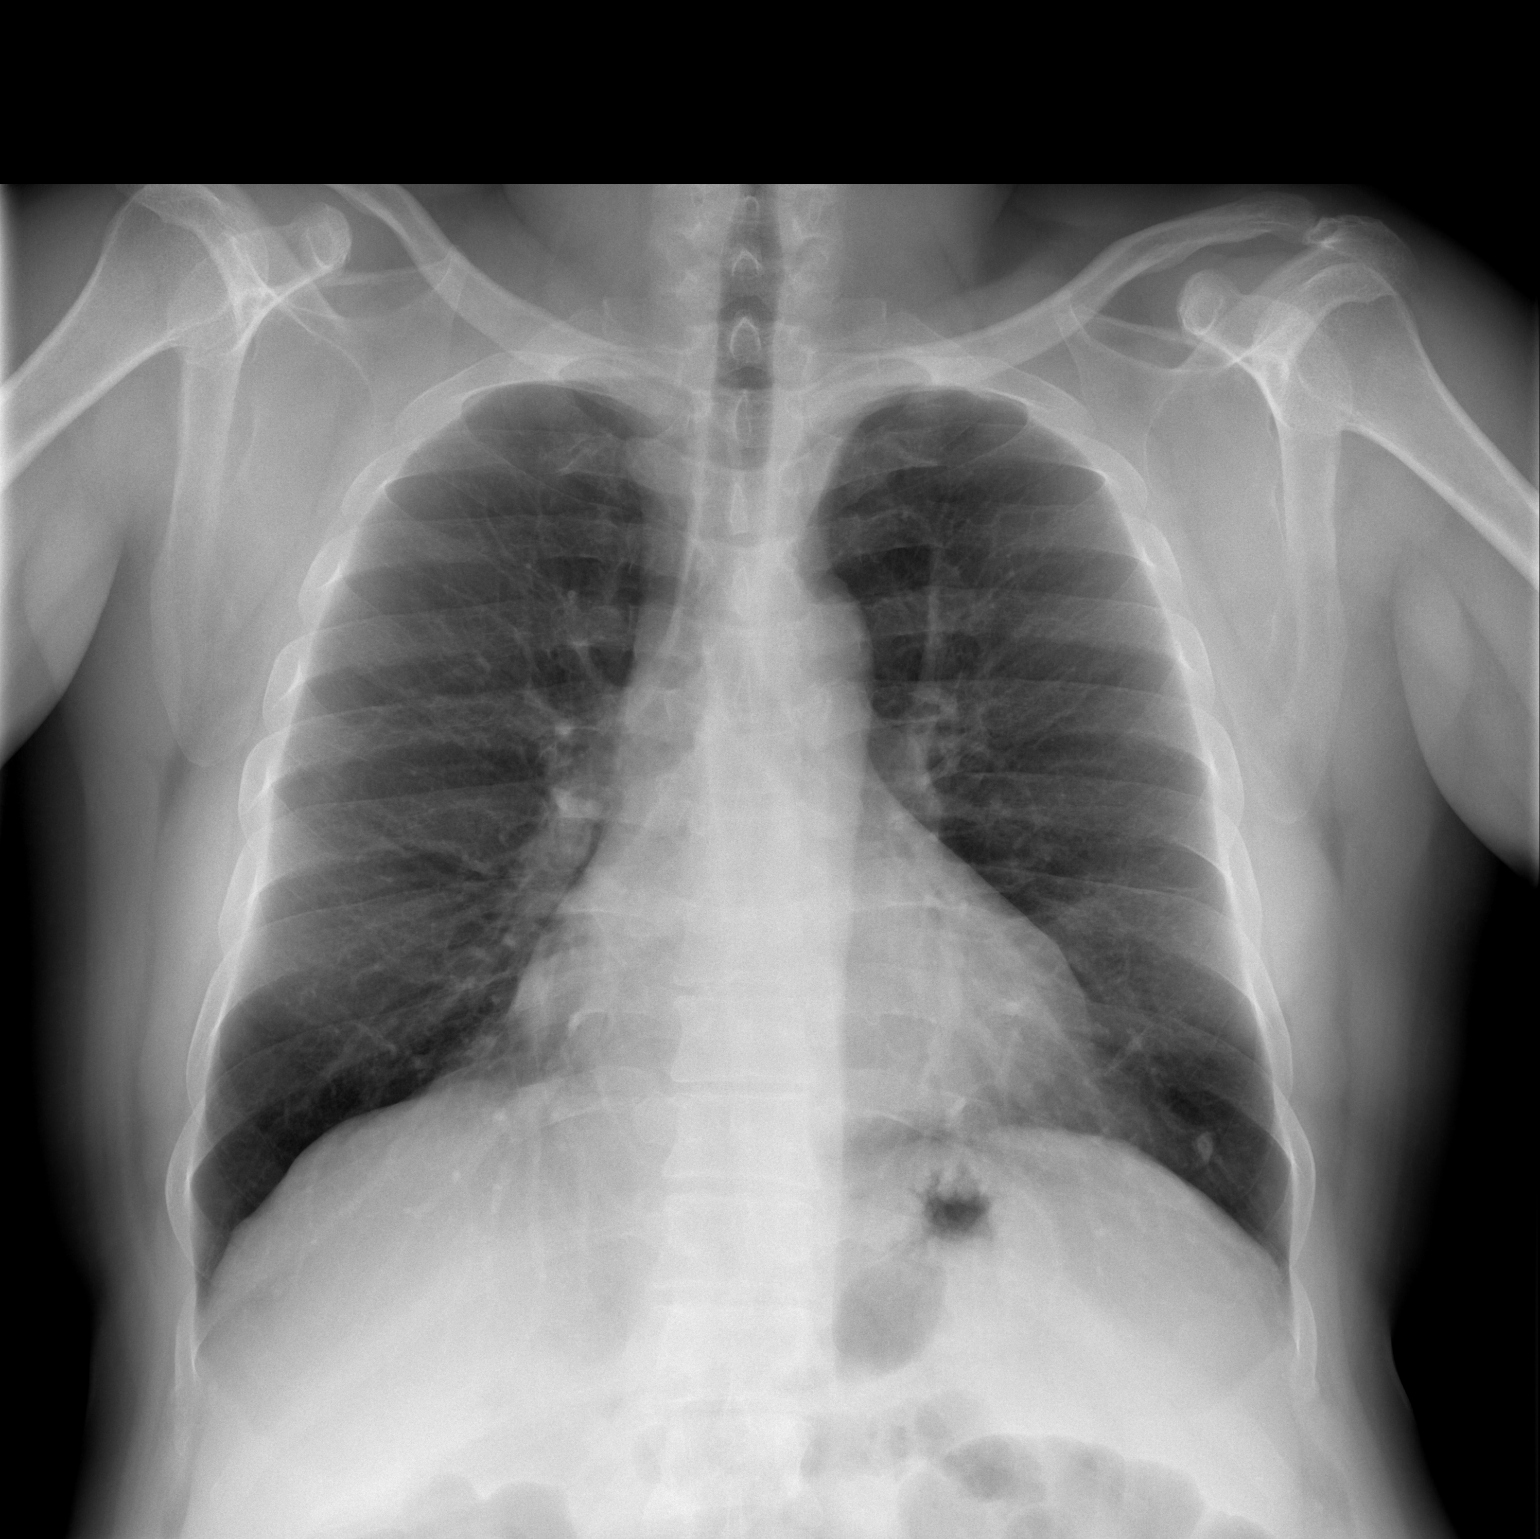

[1 of 1 positions shown; findings below may reference images not displayed]

FINDINGS: Cardiac shadow is within normal limits. The lungs are clear
bilaterally. A small density is noted in the left lateral
costophrenic angle likely representing small calcified granuloma. No
other focal abnormality is seen.
IMPRESSION: Left basilar granuloma.  No acute abnormality is noted.

## 2015-03-30 ENCOUNTER — Other Ambulatory Visit: Payer: Self-pay | Admitting: Internal Medicine

## 2015-04-02 ENCOUNTER — Other Ambulatory Visit: Payer: Self-pay

## 2015-04-02 NOTE — Telephone Encounter (Signed)
Spoke with the pharmacy.  Pt has an old AVS with Stribild listed on it.  He does not remember that his medication was changed at his last office visit.  RN will attempt to call the pt to clear up this misunderstanding.  RN used the [employer] to leave the pt a message in his own language that his medication is now [place].

## 2015-08-26 ENCOUNTER — Other Ambulatory Visit: Payer: BLUE CROSS/BLUE SHIELD

## 2015-08-26 DIAGNOSIS — B20 Human immunodeficiency virus [HIV] disease: Secondary | ICD-10-CM

## 2015-08-26 LAB — COMPREHENSIVE METABOLIC PANEL
ALBUMIN: 4.2 g/dL (ref 3.6–5.1)
ALT: 24 U/L (ref 9–46)
AST: 20 U/L (ref 10–35)
Alkaline Phosphatase: 71 U/L (ref 40–115)
BILIRUBIN TOTAL: 0.6 mg/dL (ref 0.2–1.2)
BUN: 10 mg/dL (ref 7–25)
CO2: 26 mmol/L (ref 20–31)
CREATININE: 0.85 mg/dL (ref 0.70–1.33)
Calcium: 8.8 mg/dL (ref 8.6–10.3)
Chloride: 102 mmol/L (ref 98–110)
GLUCOSE: 154 mg/dL — AB (ref 65–99)
Potassium: 4.2 mmol/L (ref 3.5–5.3)
SODIUM: 140 mmol/L (ref 135–146)
Total Protein: 6.8 g/dL (ref 6.1–8.1)

## 2015-08-26 LAB — CBC
HCT: 46.1 % (ref 39.0–52.0)
HEMOGLOBIN: 16.3 g/dL (ref 13.0–17.0)
MCH: 31.2 pg (ref 26.0–34.0)
MCHC: 35.4 g/dL (ref 30.0–36.0)
MCV: 88.1 fL (ref 78.0–100.0)
MPV: 11.4 fL (ref 8.6–12.4)
PLATELETS: 170 10*3/uL (ref 150–400)
RBC: 5.23 MIL/uL (ref 4.22–5.81)
RDW: 13 % (ref 11.5–15.5)
WBC: 6.2 10*3/uL (ref 4.0–10.5)

## 2015-08-26 LAB — LIPID PANEL
Cholesterol: 205 mg/dL — ABNORMAL HIGH (ref 125–200)
HDL: 34 mg/dL — ABNORMAL LOW (ref >=40)
LDL CALC: 142 mg/dL — AB (ref <130)
Total CHOL/HDL Ratio: 6 Ratio — ABNORMAL HIGH (ref <=5.0)
Triglycerides: 144 mg/dL (ref <150)
VLDL: 29 mg/dL (ref <30)

## 2015-08-27 LAB — T-HELPER CELL (CD4) - (RCID CLINIC ONLY)
CD4 T CELL ABS: 380 /uL — AB (ref 400–2700)
CD4 T CELL HELPER: 13 % — AB (ref 33–55)

## 2015-08-27 LAB — HIV-1 RNA QUANT-NO REFLEX-BLD: HIV-1 RNA Quant, Log: <1.3 Log copies/mL (ref <1.30)

## 2015-09-17 ENCOUNTER — Ambulatory Visit (INDEPENDENT_AMBULATORY_CARE_PROVIDER_SITE_OTHER): Payer: BLUE CROSS/BLUE SHIELD | Admitting: Internal Medicine

## 2015-09-17 ENCOUNTER — Encounter: Payer: Self-pay | Admitting: Internal Medicine

## 2015-09-17 DIAGNOSIS — B20 Human immunodeficiency virus [HIV] disease: Secondary | ICD-10-CM

## 2015-09-17 NOTE — Progress Notes (Signed)
Patient Active Problem List   Diagnosis Date Noted  . Human immunodeficiency virus (HIV) disease ([place]) 01/26/2014    Priority: High  . HTN (hypertension) 02/27/2014  . Dyslipidemia 02/27/2014  . Obesity 02/27/2014  . Thrombocytopenia, unspecified ([place]) 12/27/2013    Patient's Medications  New Prescriptions   No medications on file  Previous Medications   ELVITEGRAVIR-COBICISTAT-EMTRICITABINE-TENOFOVIR (GENVOYA) 150-150-200-10 MG TABS TABLET    Take 1 tablet by mouth daily with breakfast.  Modified Medications   No medications on file  Discontinued Medications   No medications on file    Subjective: [place] is in for his routine HIV follow-up visit. He takes his Genvoya each evening with a meal. He has had no problems tolerating it and denies missing any doses since his last visit. He has no current complaints or concerns about his health.   Review of Systems: Review of Systems  Constitutional: Negative for fever, chills, weight loss, malaise/fatigue and diaphoresis.  HENT: Negative for sore throat.   Respiratory: Negative for cough, sputum production and shortness of breath.   Cardiovascular: Negative for chest pain.  Gastrointestinal: Negative for nausea, vomiting and diarrhea.  Musculoskeletal: Negative for joint pain.  Skin: Negative for rash.  Psychiatric/Behavioral: Negative for depression and substance abuse. The patient is not nervous/anxious.     Past Medical History  Diagnosis Date  . HIV infection ([place])   . Hyperlipidemia   . Hypertension     Social History  Substance Use Topics  . Smoking status: Never Smoker   . Smokeless tobacco: None  . Alcohol Use: No    Family History  Problem Relation Age of Onset  . Hypertension Mother     No Known Allergies  Objective:  Filed Vitals:   09/17/15 0853  BP: 144/83  Pulse: 71  Temp: 97.7 F (36.5 C)  Height: 5\' 5"  (1.651 m)  Weight: 189 lb 1.9 oz (85.784 kg)   Body mass index is 31.47  kg/(m^2).  Physical Exam  Constitutional: He is oriented to person, place, and time.  He is in good spirits. The interpreter is with him today  HENT:  Mouth/Throat: No oropharyngeal exudate.  Eyes: Conjunctivae are normal.  Cardiovascular: Normal rate and regular rhythm.   No murmur heard. Pulmonary/Chest: Breath sounds normal.  Abdominal: Soft. He exhibits no mass. There is no tenderness.  Musculoskeletal: Normal range of motion.  Neurological: He is alert and oriented to person, place, and time.  Skin: No rash noted.  Psychiatric: Mood and affect normal.    Lab Results Lab Results  Component Value Date   WBC 6.2 08/26/2015   HGB 16.3 08/26/2015   HCT 46.1 08/26/2015   MCV 88.1 08/26/2015   PLT 170 08/26/2015    Lab Results  Component Value Date   CREATININE 0.85 08/26/2015   BUN 10 08/26/2015   NA 140 08/26/2015   K 4.2 08/26/2015   CL 102 08/26/2015   CO2 26 08/26/2015    Lab Results  Component Value Date   ALT 24 08/26/2015   AST 20 08/26/2015   ALKPHOS 71 08/26/2015   BILITOT 0.6 08/26/2015    Lab Results  Component Value Date   CHOL 205* 08/26/2015   HDL 34* 08/26/2015   LDLCALC 142* 08/26/2015   TRIG 144 08/26/2015   CHOLHDL 6.0* 08/26/2015    Lab Results HIV 1 RNA QUANT (copies/mL)  Date Value  08/26/2015 <20  11/20/2014 <20  08/21/2014 <20   CD4  T CELL ABS (/uL)  Date Value  08/26/2015 380*  11/20/2014 290*  08/21/2014 290*      Problem List Items Addressed This Visit      High   Human immunodeficiency virus (HIV) disease ([place])    His HIV infection is coming under excellent control and he is having steady CD4 reconstitution since starting therapy 1-1/2 years ago. He will follow-up after lab work in 6 months. I will continue Genvoya.      Relevant Orders   T-helper cell (CD4)- ([facility] clinic only)   HIV 1 RNA quant-no reflex-bld   CBC   Comprehensive metabolic panel   Lipid panel   RPR        Michel Bickers, MD [facility]  for [place] [phone] pager   [phone] cell 09/17/2015, 9:08 AM

## 2015-09-17 NOTE — Assessment & Plan Note (Signed)
His HIV infection is coming under excellent control and he is having steady CD4 reconstitution since starting therapy 1-1/2 years ago. He will follow-up after lab work in 6 months. I will continue Genvoya.

## 2016-03-03 ENCOUNTER — Other Ambulatory Visit: Payer: BLUE CROSS/BLUE SHIELD

## 2016-03-03 DIAGNOSIS — B20 Human immunodeficiency virus [HIV] disease: Secondary | ICD-10-CM

## 2016-03-03 LAB — COMPREHENSIVE METABOLIC PANEL
ALBUMIN: 4 g/dL (ref 3.6–5.1)
ALT: 29 U/L (ref 9–46)
AST: 17 U/L (ref 10–35)
Alkaline Phosphatase: 69 U/L (ref 40–115)
BUN: 10 mg/dL (ref 7–25)
CHLORIDE: 101 mmol/L (ref 98–110)
CO2: 22 mmol/L (ref 20–31)
CREATININE: 0.71 mg/dL (ref 0.70–1.33)
Calcium: 8.4 mg/dL — ABNORMAL LOW (ref 8.6–10.3)
Glucose, Bld: 139 mg/dL — ABNORMAL HIGH (ref 65–99)
Potassium: 4.4 mmol/L (ref 3.5–5.3)
SODIUM: 138 mmol/L (ref 135–146)
Total Bilirubin: 0.5 mg/dL (ref 0.2–1.2)
Total Protein: 6.2 g/dL (ref 6.1–8.1)

## 2016-03-03 LAB — LIPID PANEL
CHOL/HDL RATIO: 3.9 ratio (ref <=5.0)
Cholesterol: 222 mg/dL — ABNORMAL HIGH (ref 125–200)
HDL: 57 mg/dL (ref >=40)
LDL CALC: 137 mg/dL — AB (ref <130)
TRIGLYCERIDES: 141 mg/dL (ref <150)
VLDL: 28 mg/dL (ref <30)

## 2016-03-03 LAB — CBC
HCT: 46.6 % (ref 38.5–50.0)
HEMOGLOBIN: 16 g/dL (ref 13.2–17.1)
MCH: 31.3 pg (ref 27.0–33.0)
MCHC: 34.3 g/dL (ref 32.0–36.0)
MCV: 91.2 fL (ref 80.0–100.0)
MPV: 10.5 fL (ref 7.5–12.5)
PLATELETS: 153 10*3/uL (ref 140–400)
RBC: 5.11 MIL/uL (ref 4.20–5.80)
RDW: 13.4 % (ref 11.0–15.0)
WBC: 7.1 10*3/uL (ref 3.8–10.8)

## 2016-03-04 LAB — HIV-1 RNA QUANT-NO REFLEX-BLD: HIV 1 RNA Quant: <20 copies/mL (ref <20)

## 2016-03-04 LAB — T-HELPER CELL (CD4) - (RCID CLINIC ONLY)
CD4 T CELL ABS: 400 /uL (ref 400–2700)
CD4 T CELL HELPER: 15 % — AB (ref 33–55)

## 2016-03-04 LAB — RPR

## 2016-03-15 ENCOUNTER — Other Ambulatory Visit: Payer: Self-pay | Admitting: Internal Medicine

## 2016-03-15 DIAGNOSIS — B20 Human immunodeficiency virus [HIV] disease: Secondary | ICD-10-CM

## 2016-03-17 ENCOUNTER — Ambulatory Visit (INDEPENDENT_AMBULATORY_CARE_PROVIDER_SITE_OTHER): Payer: BLUE CROSS/BLUE SHIELD | Admitting: Internal Medicine

## 2016-03-17 DIAGNOSIS — B20 Human immunodeficiency virus [HIV] disease: Secondary | ICD-10-CM

## 2016-03-17 NOTE — Assessment & Plan Note (Signed)
His infection remains under excellent control. He will continue Genvoya and follow-up after lab work in 6 months. We've given him a new co-pay card and instructed him how to sign up for MyChart.

## 2016-03-17 NOTE — Progress Notes (Signed)
Patient Active Problem List   Diagnosis Date Noted  . Human immunodeficiency virus (HIV) disease ([place]) 01/26/2014    Priority: High  . HTN (hypertension) 02/27/2014  . Dyslipidemia 02/27/2014  . Obesity 02/27/2014  . Thrombocytopenia, unspecified ([place]) 12/27/2013    Patient's Medications  New Prescriptions   No medications on file  Previous Medications   GENVOYA 150-150-200-10 MG TABS TABLET    TAKE 1 TABLET BY MOUTH DAILY WITH BREAKFAST  Modified Medications   No medications on file  Discontinued Medications   No medications on file    Subjective: Isaiah Mclaughlin Is in for his routine HIV follow-up visit. He denies having any problem obtaining, taking or tolerating his Genvoya. He takes it each afternoon with food. He does not recall missing any doses. He is on no other medications. He states that he is doing well.  Review of Systems: Review of Systems  Constitutional: Negative for fever, chills, weight loss, malaise/fatigue and diaphoresis.  HENT: Negative for sore throat.   Respiratory: Negative for cough, sputum production and shortness of breath.   Cardiovascular: Negative for chest pain.  Gastrointestinal: Negative for nausea, vomiting, abdominal pain and diarrhea.  Genitourinary: Negative for dysuria.  Musculoskeletal: Negative for myalgias and joint pain.  Skin: Negative for rash.  Neurological: Negative for headaches.    Past Medical History  Diagnosis Date  . HIV infection ([place])   . Hyperlipidemia   . Hypertension     Social History  Substance Use Topics  . Smoking status: Never Smoker   . Smokeless tobacco: Not on file  . Alcohol Use: No    Family History  Problem Relation Age of Onset  . Hypertension Mother     No Known Allergies  Objective:  Filed Vitals:   03/17/16 0928  BP: 168/80  Pulse: 77  Temp: 97.9 F (36.6 C)  TempSrc: Oral  Weight: 186 lb (84.369 kg)   Body mass index is 30.95 kg/(m^2).  Physical Exam    Constitutional: He is oriented to person, place, and time.  He is smiling and in good spirits. He is interviewed today with the assistance of the phone interpreter.  HENT:  Mouth/Throat: No oropharyngeal exudate.  Eyes: Conjunctivae are normal.  Cardiovascular: Normal rate and regular rhythm.   No murmur heard. Pulmonary/Chest: Breath sounds normal.  Abdominal: Soft. There is no tenderness.  Neurological: He is alert and oriented to person, place, and time.  Skin: No rash noted.  Psychiatric: Mood and affect normal.    Lab Results Lab Results  Component Value Date   WBC 7.1 03/03/2016   HGB 16.0 03/03/2016   HCT 46.6 03/03/2016   MCV 91.2 03/03/2016   PLT 153 03/03/2016    Lab Results  Component Value Date   CREATININE 0.71 03/03/2016   BUN 10 03/03/2016   NA 138 03/03/2016   K 4.4 03/03/2016   CL 101 03/03/2016   CO2 22 03/03/2016    Lab Results  Component Value Date   ALT 29 03/03/2016   AST 17 03/03/2016   ALKPHOS 69 03/03/2016   BILITOT 0.5 03/03/2016    Lab Results  Component Value Date   CHOL 222* 03/03/2016   HDL 57 03/03/2016   LDLCALC 137* 03/03/2016   TRIG 141 03/03/2016   CHOLHDL 3.9 03/03/2016    Lab Results HIV 1 RNA QUANT (copies/mL)  Date Value  03/03/2016 <20  08/26/2015 <20  11/20/2014 <20   CD4 T CELL ABS (/uL)  Date Value  03/03/2016 400  08/26/2015 380*  11/20/2014 290*      Problem List Items Addressed This Visit      High   Human immunodeficiency virus (HIV) disease ([place])    His infection remains under excellent control. He will continue Genvoya and follow-up after lab work in 6 months. We've given him a new co-pay card and instructed him how to sign up for MyChart.      Relevant Orders   T-helper cell (CD4)- ([facility] clinic only)   HIV 1 RNA quant-no reflex-bld   CBC   Comprehensive metabolic panel   Lipid panel   RPR        Michel Bickers, MD [facility] for [place] [phone] pager   [phone] cell 03/17/2016, 9:53 AM

## 2016-04-22 ENCOUNTER — Encounter: Payer: Self-pay | Admitting: Internal Medicine

## 2016-05-30 ENCOUNTER — Other Ambulatory Visit: Payer: Self-pay | Admitting: Internal Medicine

## 2016-05-30 DIAGNOSIS — B20 Human immunodeficiency virus [HIV] disease: Secondary | ICD-10-CM

## 2016-09-02 ENCOUNTER — Other Ambulatory Visit: Payer: BLUE CROSS/BLUE SHIELD

## 2016-09-15 ENCOUNTER — Ambulatory Visit: Payer: BLUE CROSS/BLUE SHIELD | Admitting: Internal Medicine

## 2016-10-13 ENCOUNTER — Ambulatory Visit: Payer: BLUE CROSS/BLUE SHIELD | Admitting: Internal Medicine
